# Patient Record
Sex: Female | Born: 1980 | Hispanic: Yes | Marital: Married | Smoking: Never smoker
Health system: Southern US, Community
[De-identification: ages and names within clinical notes are randomized; demographics above are authoritative.]

## PROBLEM LIST (undated history)

## (undated) ENCOUNTER — Inpatient Hospital Stay (HOSPITAL_COMMUNITY): Payer: Self-pay

## (undated) DIAGNOSIS — D696 Thrombocytopenia, unspecified: Secondary | ICD-10-CM

## (undated) HISTORY — PX: NO PAST SURGERIES: SHX2092

---

## 2014-08-19 NOTE — L&D Delivery Note (Cosign Needed)
Delivery Note At 5:36 AM a viable female was delivered via Vaginal, Spontaneous Delivery (Presentation: OA ) by Dr Freida Busman.  APGAR: 7, 9; weight 4 lb 15.5 oz (2255 g).  Infant dried and placed on pt's abd.  Cord clamped and cut by FOB. Hospital cord blood sample collected.  Placenta status: Intact, Spontaneous.  Cord: 3 vessels   Anesthesia: None  Episiotomy: None Lacerations: None Est. Blood Loss (mL):  100  Pt given  of cytotec PO as a preventative measure. FF w/ nl lochia.  Mom to postpartum.  Baby to Couplet care / Skin to Skin.   Cam Hai CNM 03/30/2015, 5:58 AM

## 2014-10-18 ENCOUNTER — Inpatient Hospital Stay (HOSPITAL_COMMUNITY)
Admission: AD | Admit: 2014-10-18 | Discharge: 2014-10-18 | Disposition: A | Discharge status: Home / Self Care | Payer: Medicaid Other | Source: Ambulatory Visit | Attending: Family Medicine | Admitting: Family Medicine

## 2014-10-18 ENCOUNTER — Inpatient Hospital Stay (HOSPITAL_COMMUNITY): Payer: Medicaid Other

## 2014-10-18 ENCOUNTER — Encounter (HOSPITAL_COMMUNITY): Payer: Self-pay

## 2014-10-18 DIAGNOSIS — Z3A15 15 weeks gestation of pregnancy: Secondary | ICD-10-CM

## 2014-10-18 DIAGNOSIS — O209 Hemorrhage in early pregnancy, unspecified: Secondary | ICD-10-CM | POA: Insufficient documentation

## 2014-10-18 DIAGNOSIS — R109 Unspecified abdominal pain: Secondary | ICD-10-CM

## 2014-10-18 DIAGNOSIS — Z3A16 16 weeks gestation of pregnancy: Secondary | ICD-10-CM | POA: Insufficient documentation

## 2014-10-18 DIAGNOSIS — O9989 Other specified diseases and conditions complicating pregnancy, childbirth and the puerperium: Secondary | ICD-10-CM

## 2014-10-18 DIAGNOSIS — O26899 Other specified pregnancy related conditions, unspecified trimester: Secondary | ICD-10-CM | POA: Insufficient documentation

## 2014-10-18 HISTORY — DX: Thrombocytopenia, unspecified: D69.6

## 2014-10-18 LAB — CBC WITH DIFFERENTIAL/PLATELET
Basophils Absolute: 0 10*3/uL (ref 0.0–0.1)
Basophils Relative: 0 % (ref 0–1)
EOS PCT: 1 % (ref 0–5)
Eosinophils Absolute: 0.1 10*3/uL (ref 0.0–0.7)
HCT: 35.6 % — ABNORMAL LOW (ref 36.0–46.0)
HEMOGLOBIN: 12.1 g/dL (ref 12.0–15.0)
LYMPHS ABS: 1.8 10*3/uL (ref 0.7–4.0)
LYMPHS PCT: 20 % (ref 12–46)
MCH: 27.7 pg (ref 26.0–34.0)
MCHC: 34 g/dL (ref 30.0–36.0)
MCV: 81.5 fL (ref 78.0–100.0)
MONO ABS: 0.5 10*3/uL (ref 0.1–1.0)
Monocytes Relative: 6 % (ref 3–12)
Neutro Abs: 6.4 10*3/uL (ref 1.7–7.7)
Neutrophils Relative %: 73 % (ref 43–77)
Platelets: 253 10*3/uL (ref 150–400)
RBC: 4.37 MIL/uL (ref 3.87–5.11)
RDW: 13.7 % (ref 11.5–15.5)
WBC: 8.8 10*3/uL (ref 4.0–10.5)

## 2014-10-18 LAB — OB RESULTS CONSOLE GC/CHLAMYDIA: GC PROBE AMP, GENITAL: NEGATIVE

## 2014-10-18 LAB — COMPREHENSIVE METABOLIC PANEL
ALBUMIN: 3.7 g/dL (ref 3.5–5.2)
ALT: 19 U/L (ref 0–35)
ANION GAP: 9 (ref 5–15)
AST: 22 U/L (ref 0–37)
Alkaline Phosphatase: 48 U/L (ref 39–117)
BUN: 7 mg/dL (ref 6–23)
CALCIUM: 8.9 mg/dL (ref 8.4–10.5)
CO2: 25 mmol/L (ref 19–32)
CREATININE: 0.35 mg/dL — AB (ref 0.50–1.10)
Chloride: 104 mmol/L (ref 96–112)
GFR calc Af Amer: >90 mL/min (ref >90)
GFR calc non Af Amer: >90 mL/min (ref >90)
Glucose, Bld: 90 mg/dL (ref 70–99)
Potassium: 3.8 mmol/L (ref 3.5–5.1)
Sodium: 138 mmol/L (ref 135–145)
TOTAL PROTEIN: 7.3 g/dL (ref 6.0–8.3)
Total Bilirubin: 0.2 mg/dL — ABNORMAL LOW (ref 0.3–1.2)

## 2014-10-18 LAB — URINALYSIS, ROUTINE W REFLEX MICROSCOPIC
BILIRUBIN URINE: NEGATIVE
GLUCOSE, UA: NEGATIVE mg/dL
Ketones, ur: 15 mg/dL — AB
Leukocytes, UA: NEGATIVE
Nitrite: NEGATIVE
PROTEIN: NEGATIVE mg/dL
Specific Gravity, Urine: 1.025 (ref 1.005–1.030)
UROBILINOGEN UA: 0.2 mg/dL (ref 0.0–1.0)
pH: 5.5 (ref 5.0–8.0)

## 2014-10-18 LAB — URINE MICROSCOPIC-ADD ON

## 2014-10-18 LAB — WET PREP, GENITAL
Clue Cells Wet Prep HPF POC: NONE SEEN
Trich, Wet Prep: NONE SEEN
Yeast Wet Prep HPF POC: NONE SEEN

## 2014-10-18 NOTE — MAU Note (Signed)
LMP 06/29/14; has not been seen anywhere during this pregnancy. Vaginal bleeding x 1 month, 2-3 times per week; sometimes has to wear a pad, no clots.Passed out today d/t abdominal pain and dizziness.

## 2014-10-18 NOTE — MAU Provider Note (Signed)
History     CSN: [id number]  Arrival date and time: 10/18/14 4098   First Provider Initiated Contact with Patient 10/18/14 2042      Chief Complaint  Patient presents with  . Abdominal Pain  . Vaginal Bleeding   HPI  Ms. Dana Gibson is a 34 y.o. G2P0010 at [redacted]w[redacted]d who presents to MAU today with complaint of vaginal bleeding. The patient has not been seen anywhere yet for this pregnancy. She states that she has noted bleeding ~ 3 days/week x 4-6 weeks. She has some bleeding earlier today, but denies bleeding upon arrival in MAU. She states mild cramping earlier today as well that has resolved. She has also had morning sickness. She denies nausea now. She denies vaginal discharge or complications with her previous pregnancy. She does have a history of thrombocytopenia.   OB History    Gravida Para Term Preterm AB TAB SAB Ectopic Multiple Living   Past Medical History  Diagnosis Date  . Thrombocytopenia     History reviewed. No pertinent past surgical history.  History reviewed. No pertinent family history.  History  Substance Use Topics  . Smoking status: Never Smoker   . Smokeless tobacco: Not on file  . Alcohol Use: No    Allergies:  Allergies  Allergen Reactions  . Asa [Aspirin] Other (See Comments)    PT HAS THROMBOCYTOPENIA-   ASA   MAKES  HER BLEED MORE    No prescriptions prior to admission    Review of Systems  Constitutional: Negative for fever.  Gastrointestinal: Positive for nausea and vomiting. Negative for abdominal pain, diarrhea and constipation.  Genitourinary: Negative for dysuria, urgency and frequency.       + vaginal bleeding   Physical Exam   Blood pressure 122/81, pulse 90, temperature 98.6 F (37 C), temperature source Oral, resp. rate 16, height  (1.575 m), weight 130 lb (58.968 kg), last menstrual period 06/29/2014, SpO2 100 %.  Physical Exam  Constitutional: She is oriented to person, place, and time.  She appears well-developed and well-nourished.  HENT:  Head: Normocephalic.  Cardiovascular: Normal rate.   Respiratory: Effort normal.  GI: Soft. She exhibits no distension and no mass. There is no tenderness. There is no rebound and no guarding.  Genitourinary: Uterus is enlarged (appropriate for GA). Uterus is not tender. Cervix exhibits friability. Cervix exhibits no motion tenderness and no discharge. No bleeding in the vagina. Vaginal discharge (small amount of clear, mucus discharge noted) found.  Cervix: closed, thick  Neurological: She is alert and oriented to person, place, and time.  Skin: Skin is warm and dry. No erythema.  Psychiatric: She has a normal mood and affect.   Results for orders placed or performed during the hospital encounter of 10/18/14 (from the past 24 hour(s))  Urinalysis, Routine w reflex microscopic     Status: Abnormal   Collection Time: 10/18/14  7:49 PM  Result Value Ref Range   Color, Urine YELLOW YELLOW   APPearance CLEAR CLEAR   Specific Gravity, Urine 1.025 1.005 - 1.030   pH 5.5 5.0 - 8.0   Glucose, UA NEGATIVE NEGATIVE mg/dL   Hgb urine dipstick SMALL (A) NEGATIVE   Bilirubin Urine NEGATIVE NEGATIVE   Ketones, ur 15 (A) NEGATIVE mg/dL   Protein, ur NEGATIVE NEGATIVE mg/dL   Urobilinogen, UA 0.2 0.0 - 1.0 mg/dL   Nitrite NEGATIVE NEGATIVE   Leukocytes,  UA NEGATIVE NEGATIVE  Urine microscopic-add on     Status: None   Collection Time: 10/18/14  7:49 PM  Result Value Ref Range   Squamous Epithelial / LPF RARE RARE   WBC, UA 0-2 <3 WBC/hpf   RBC / HPF 0-2 <3 RBC/hpf   Bacteria, UA RARE RARE  CBC with Differential/Platelet     Status: Abnormal   Collection Time: 10/18/14  8:51 PM  Result Value Ref Range   WBC 8.8 4.0 - 10.5 K/uL   RBC 4.37 3.87 - 5.11 MIL/uL   Hemoglobin 12.1 12.0 - 15.0 g/dL   HCT 16.135.6 (L) 09.636.0 - 04.546.0 %   MCV 81.5 78.0 - 100.0 fL   MCH 27.7 26.0 - 34.0 pg   MCHC 34.0 30.0 - 36.0 g/dL   RDW 40.913.7 81.111.5 - 91.415.5 %    Platelets 253 150 - 400 K/uL   Neutrophils Relative % 73 43 - 77 %   Neutro Abs 6.4 1.7 - 7.7 K/uL   Lymphocytes Relative 20 12 - 46 %   Lymphs Abs 1.8 0.7 - 4.0 K/uL   Monocytes Relative 6 3 - 12 %   Monocytes Absolute 0.5 0.1 - 1.0 K/uL   Eosinophils Relative 1 0 - 5 %   Eosinophils Absolute 0.1 0.0 - 0.7 K/uL   Basophils Relative 0 0 - 1 %   Basophils Absolute 0.0 0.0 - 0.1 K/uL  Comprehensive metabolic panel     Status: Abnormal   Collection Time: 10/18/14  8:51 PM  Result Value Ref Range   Sodium 138 135 - 145 mmol/L   Potassium 3.8 3.5 - 5.1 mmol/L   Chloride 104 96 - 112 mmol/L   CO2 25 19 - 32 mmol/L   Glucose, Bld 90 70 - 99 mg/dL   BUN 7 6 - 23 mg/dL   Creatinine, Ser 7.820.35 (L) 0.50 - 1.10 mg/dL   Calcium 8.9 8.4 - 95.610.5 mg/dL   Total Protein 7.3 6.0 - 8.3 g/dL   Albumin 3.7 3.5 - 5.2 g/dL   AST 22 0 - 37 U/L   ALT 19 0 - 35 U/L   Alkaline Phosphatase 48 39 - 117 U/L   Total Bilirubin 0.2 (L) 0.3 - 1.2 mg/dL   GFR calc non Af Amer >90 >90 mL/min   GFR calc Af Amer >90 >90 mL/min   Anion gap 9 5 - 15  Wet prep, genital     Status: Abnormal   Collection Time: 10/18/14 10:38 PM  Result Value Ref Range   Yeast Wet Prep HPF POC NONE SEEN NONE SEEN   Trich, Wet Prep NONE SEEN NONE SEEN   Clue Cells Wet Prep HPF POC NONE SEEN NONE SEEN   WBC, Wet Prep HPF POC FEW (A) NONE SEEN     MAU Course  Procedures None  MDM FHR - 153 bpm with doppler UA, wet prep, GC/Chlamydia, CBC and US today Preliminary US report is normal, no evidence of previa or abruption, normal cervical length 3.9 cm.  Assessment and Plan  A: SIUP at 15w Vaginal bleeding in pregnancy prior to [redacted] weeks gestation  P: Discharge home Bleeding precautions discussed [facility] per her request Patient may return to MAU as needed or if her condition were to change or worsen   Marny LowensteinJulie N Kortland Nichols, PA-C  10/19/2014, 12:12 AM

## 2014-10-18 NOTE — Progress Notes (Signed)
I assisted Programmer, multimediaDebra RN with some questions and Raynelle FanningJulie as well, by Orlan LeavensViria Alvarez Spanish Interpreter.

## 2014-10-18 NOTE — MAU Note (Signed)
PT SAYS  WITH INTERPRETER- VIRIA -        1 MTH AGO- SHE WOULD  BLEED 2-3X  / WEEK.   THEN  ON Sunday  - SHE WAS DIZZY  AND FELL X2 ON FLOOR   - SHE WAS    BLEEDING -   SHE VOIDED- A LOT OF BLOOD   COMING OUT OF VAG-   TOOK SHOWER-  THEN SAW BLOOD ON UNDERWEAR AND LEGS.        ON  Monday-  SPOTTING ON PAD.    THEN TONIGHT    FEELS TIRED AND DIZZY- AND PASSED OUT AT WORK AT  1PM-  FELL ON FLOOR  AND  HIT HEAD - RIGHT SIDE.        NOW IN MAU-  NO BLEEDING-   NO DIZZINESS

## 2014-10-18 NOTE — Discharge Instructions (Signed)
Vaginal Bleeding During Pregnancy, Second Trimester ° A small amount of bleeding (spotting) from the vagina is common in pregnancy. Sometimes the bleeding is normal and is not a problem, and sometimes it is a sign of something serious. Be sure to tell your doctor about any bleeding from your vagina right away. °HOME CARE °· Watch your condition for any changes. °· Follow your doctor's instructions about how active you can be. °· If you are on bed rest: °¨ You may need to stay in bed and only get up to use the bathroom. °¨ You may be allowed to do some activities. °¨ If you need help, make plans for someone to help you. °· Write down: °¨ The number of pads you use each day. °¨ How often you change pads. °¨ How soaked (saturated) your pads are. °· Do not use tampons. °· Do not douche. °· Do not have sex or orgasms until your doctor says it is okay. °· If you pass any tissue from your vagina, save the tissue so you can show it to your doctor. °· Only take medicines as told by your doctor. °· Do not take aspirin because it can make you bleed. °· Do not exercise, lift heavy weights, or do any activities that take a lot of energy and effort unless your doctor says it is okay. °· Keep all follow-up visits as told by your doctor. °GET HELP IF:  °· You bleed from your vagina. °· You have cramps. °· You have labor pains. °· You have a fever that does not go away after you take medicine. °GET HELP RIGHT AWAY IF: °· You have very bad cramps in your back or belly (abdomen). °· You have contractions. °· You have chills. °· You pass large clots or tissue from your vagina. °· You bleed more. °· You feel light-headed or weak. °· You pass out (faint). °· You are leaking fluid or have a gush of fluid from your vagina. °MAKE SURE YOU: °· Understand these instructions. °· Will watch your condition. °· Will get help right away if you are not doing well or get worse. °Document Released: 12/20/2013 Document Reviewed: 04/12/2013 °[employer]®  Patient Information ©2015 [employer]. This information is not intended to replace advice given to you by your health care provider. Make sure you discuss any questions you have with your health care provider. ° °

## 2014-10-19 ENCOUNTER — Encounter: Payer: Self-pay | Admitting: Obstetrics and Gynecology

## 2014-10-19 DIAGNOSIS — O209 Hemorrhage in early pregnancy, unspecified: Secondary | ICD-10-CM | POA: Insufficient documentation

## 2014-10-19 DIAGNOSIS — R109 Unspecified abdominal pain: Secondary | ICD-10-CM

## 2014-10-19 DIAGNOSIS — Z3A16 16 weeks gestation of pregnancy: Secondary | ICD-10-CM | POA: Insufficient documentation

## 2014-10-19 DIAGNOSIS — O26899 Other specified pregnancy related conditions, unspecified trimester: Secondary | ICD-10-CM | POA: Insufficient documentation

## 2014-10-19 LAB — HIV ANTIBODY (ROUTINE TESTING W REFLEX): HIV Screen 4th Generation wRfx: NONREACTIVE

## 2014-10-19 LAB — GC/CHLAMYDIA PROBE AMP (~~LOC~~) NOT AT ARMC
Chlamydia: NEGATIVE
Neisseria Gonorrhea: NEGATIVE

## 2014-11-10 ENCOUNTER — Encounter: Payer: Self-pay | Admitting: Obstetrics and Gynecology

## 2014-11-10 ENCOUNTER — Other Ambulatory Visit (HOSPITAL_COMMUNITY)
Admission: RE | Admit: 2014-11-10 | Discharge: 2014-11-10 | Disposition: A | Discharge status: Home / Self Care | Payer: Medicaid Other | Source: Ambulatory Visit | Attending: Obstetrics and Gynecology | Admitting: Obstetrics and Gynecology

## 2014-11-10 ENCOUNTER — Ambulatory Visit (INDEPENDENT_AMBULATORY_CARE_PROVIDER_SITE_OTHER): Payer: Medicaid Other | Admitting: Obstetrics and Gynecology

## 2014-11-10 ENCOUNTER — Telehealth: Payer: Self-pay | Admitting: Oncology

## 2014-11-10 VITALS — BP 118/68 | HR 105 | Temp 98.6°F | Wt 131.9 lb

## 2014-11-10 DIAGNOSIS — Z124 Encounter for screening for malignant neoplasm of cervix: Secondary | ICD-10-CM

## 2014-11-10 DIAGNOSIS — Z1151 Encounter for screening for human papillomavirus (HPV): Secondary | ICD-10-CM | POA: Diagnosis present

## 2014-11-10 DIAGNOSIS — Z23 Encounter for immunization: Secondary | ICD-10-CM

## 2014-11-10 DIAGNOSIS — Z01419 Encounter for gynecological examination (general) (routine) without abnormal findings: Secondary | ICD-10-CM | POA: Insufficient documentation

## 2014-11-10 DIAGNOSIS — R87810 Cervical high risk human papillomavirus (HPV) DNA test positive: Secondary | ICD-10-CM | POA: Diagnosis present

## 2014-11-10 DIAGNOSIS — O0990 Supervision of high risk pregnancy, unspecified, unspecified trimester: Secondary | ICD-10-CM | POA: Insufficient documentation

## 2014-11-10 DIAGNOSIS — Z3482 Encounter for supervision of other normal pregnancy, second trimester: Secondary | ICD-10-CM

## 2014-11-10 DIAGNOSIS — Z862 Personal history of diseases of the blood and blood-forming organs and certain disorders involving the immune mechanism: Secondary | ICD-10-CM | POA: Diagnosis not present

## 2014-11-10 LAB — POCT URINALYSIS DIP (DEVICE)
Bilirubin Urine: NEGATIVE
Glucose, UA: NEGATIVE mg/dL
Nitrite: NEGATIVE
Protein, ur: NEGATIVE mg/dL
Urobilinogen, UA: 0.2 mg/dL (ref 0.0–1.0)
pH: 5.5 (ref 5.0–8.0)

## 2014-11-10 NOTE — Progress Notes (Signed)
U/S 11/16/14 @ 3p with Radiology.  Contacted [facility] [phone]) for hematology appt.  Per Laurel DimmerKim H a doctor will review patient record and pt will be contacted either today or tomorrow regarding hematology appt.  Pt aware of plan.  Spanish interpreter Bettye Boeckori Arias present.  ROI obtained, [facility] [place].

## 2014-11-10 NOTE — Patient Instructions (Signed)
Segundo trimestre de Psychiatristembarazo (Second Trimester of Pregnancy) El segundo trimestre va desde la semana13 hasta la 28, desde el cuarto hasta el sexto mes, y suele ser el momento en el que mejor se siente. Su organismo se ha adaptado a Charity fundraiserestar embarazada y comienza a Diplomatic Services operational officersentirse fsicamente mejor. En general, las nuseas matutinas han disminuido o han desaparecido completamente, p El segundo trimestre es tambin la poca en la que el feto se desarrolla rpidamente. Hacia el final del sexto mes, el feto mide aproximadamente 9pulgadas (23cm) y pesa alrededor de 1 libras (700g). Es probable que sienta que el beb se Teacher, English as a foreign languagemueve (da pataditas) entre las 18 y 20semanas del Psychiatristembarazo. CAMBIOS EN EL ORGANISMO Su organismo atraviesa por muchos cambios durante el [place]embarazo, y estos varan de Neomia Dearuna mujer a Educational psychologistotra.   Seguir American Standard Companiesaumentando de peso. Notar que la parte baja del abdomen sobresale.  Podrn aparecer las primeras Albertson'sestras en las caderas, el abdomen y las [place] Shar[place]burgmamas.  Es posible que tenga dolores de cabeza que pueden aliviarse con los medicamentos que su mdico autorice.  Tal vez tenga necesidad de orinar con ms frecuencia porque el feto est ejerciendo presin Ambulance personsobre la vejiga.  Debido al Vanetta Muldersembarazo podr sentir Anthoney Haradaacidez estomacal con frecuencia.  Puede estar estreida, ya que ciertas hormonas enlentecen los movimientos de los msculos que New York Life Insuranceempujan los desechos a travs de los intestinos.  Pueden aparecer hemorroides o abultarse e hincharse las venas (venas varicosas).  Puede tener dolor de espalda que se debe al Citigroupaumento de peso y a que las hormonas del Management consultantembarazo relajan las articulaciones entre los huesos de la pelvis, y Public librariancomo consecuencia de la modificacin del peso y los msculos que mantienen el equilibrio.  Las ConAgra Foodsmamas seguirn creciendo y Development worker, communityle dolern.  Las Veterinary surgeonencas pueden sangrar y estar sensibles al cepillado y al hilo dental.  Pueden aparecer zonas oscuras o manchas (cloasma, mscara del Psychiatristembarazo) en el rostro que  probablemente se atenuarn despus del nacimiento del beb.  Es posible que se forme una lnea oscura desde el ombligo hasta la zona del pubis (linea nigra) que probablemente se atenuarn despus del nacimiento del beb.  Tal vez haya cambios en el cabello que pueden incluir su engrosamiento, crecimiento rpido y cambios en la textura. Adems, a algunas mujeres se les cae el cabello durante o despus del embarazo, o tienen el cabello seco o fino. Lo ms probable es que el cabello se le normalice despus del nacimiento del beb. QU DEBE ESPERAR EN LAS CONSULTAS PRENATALES Durante una visita prenatal de rutina:  La pesarn para asegurarse de que usted y el feto estn creciendo normalmente.  Le tomarn la presin arterial.  Le medirn el abdomen para controlar el desarrollo del beb.  Se escucharn los latidos cardacos fetales.  Se evaluarn los resultados de los estudios solicitados en visitas anteriores. El mdico puede preguntarle lo siguiente:  Cmo se siente.  Si siente los movimientos del beb.  Si ha tenido sntomas anormales, como prdida de lquido, Winterssangrado, dolores de cabeza intensos o clicos abdominales.  Si tiene Colgate-Palmolivealguna pregunta. Otros estudios que podrn realizarse durante el segundo trimestre incluyen lo siguiente:  Anlisis de sangre para detectar:  Concentraciones de hierro bajas (anemia).  Diabetes gestacional (entre la semana 24 y la 28).  Anticuerpos Rh.  Anlisis de orina para detectar infecciones, diabetes o protenas en la orina.  Una ecografa para confirmar que el beb crece y se desarrolla correctamente.  Una amniocentesis para diagnosticar posibles problemas genticos.  Estudios del feto para descartar espina  bfida y sndrome de Down. INSTRUCCIONES PARA EL CUIDADO EN EL HOGAR   Evite fumar, consumir hierbas, beber alcohol y tomar frmacos que no le hayan recetado. Estas sustancias qumicas afectan la formacin y el desarrollo del beb.  Siga  las indicaciones del mdico en relacin con el uso de medicamentos. Durante el embarazo, hay medicamentos que son seguros de tomar y otros que no.  Haga actividad fsica solo en la forma indicada por el mdico. Sentir clicos uterinos es un buen signo para Restaurant manager, fast fooddetener la actividad fsica.  Contine comiendo alimentos que sanos con regularidad.  Use un sostn que le brinde buen soporte si le Altria Groupduelen las mamas.  No se d baos de inmersin en agua caliente, baos turcos ni saunas.  Colquese el cinturn de seguridad cuando conduzca.  No coma carne cruda ni queso sin cocinar; evite el contacto con las bandejas sanitarias de los gatos y la tierra que estos animales usan. Estos elementos contienen grmenes que pueden causar defectos congnitos en el beb.  Tome las vitaminas prenatales.  Si est estreida, pruebe un laxante suave (si el mdico lo autoriza). Consuma ms alimentos ricos en fibra, como vegetales y frutas frescos y Radiation protection practitionercereales integrales. Beba gran cantidad de lquido para mantener la orina de tono claro o color amarillo plido.  Dese baos de asiento con agua tibia para Engineer, materialsaliviar el dolor o las molestias causadas por las hemorroides. Use una crema para las hemorroides si el mdico la autoriza.  Si tiene venas varicosas, use medias de descanso. Eleve los pies durante 15minutos, 3 o 4veces por da. Limite la cantidad de sal en su dieta.  No levante objetos pesados, use zapatos de tacones bajos y [address] Boulevardmantenga una buena postura.  Descanse con las piernas elevadas si tiene calambres o dolor de cintura.  Visite a su dentista si an no lo ha Occupational hygienisthecho durante el embarazo. Use un cepillo de dientes blando para higienizarse los dientes y psese el hilo dental con suavidad.  Puede seguir Calpine Corporationmanteniendo relaciones sexuales, a menos que el mdico le indique lo contrario.  Concurra a todas las visitas prenatales segn las indicaciones de su mdico. SOLICITE ATENCIN MDICA SI:   Santa Generaiene mareos.  Siente  clicos leves, presin en la pelvis o dolor persistente en el abdomen.  Tiene nuseas, vmitos o diarrea persistentes.  Tiene secrecin vaginal con mal olor.  Siente dolor al ConocoPhillipsorinar. SOLICITE ATENCIN MDICA DE INMEDIATO SI:   Tiene fiebre.  Tiene una prdida de lquido por la vagina.  Tiene sangrado o pequeas prdidas vaginales.  Siente dolor intenso o clicos en el abdomen.  Sube o baja de peso rpidamente.  Tiene dificultad para respirar y siente dolor de pecho.  Sbitamente se le hinchan mucho el rostro, las South Wilmingtonmanos, los tobillos, los pies o las piernas.  No ha sentido los movimientos del beb durante Georgianne Fickuna hora.  Siente un dolor de cabeza intenso que no se alivia con medicamentos.  Hay cambios en la visin. Document Released: 05/15/2005 Document Revised: 08/10/2013 [facility] LLCExitCare Patient Information 2015 [place]apanExitCare, MarylandLLC. This information is not intended to replace advice given to you by your health care provider. Make sure you discuss any questions you have with your health care provider.  Eleccin del mtodo anticonceptivo (Contraception Choices) La anticoncepcin (control de la natalidad) es el uso de cualquier mtodo o dispositivo para Location managerevitar el embarazo. A continuacin se indican algunos de esos mtodos. MTODOS HORMONALES   El Implante contraconceptivo consiste en un tubo plstico delgado que contiene la hormona progesterona. No contiene  estrgenos. El mdico inserta el tubo en la parte interna del brazo. El tubo puede Geneticist, molecularpermanecer en el lugar durante 3 aos. Despus de los 3 aos debe retirarse. El implante impide que los ovarios liberen vulos (ovulacin), espesa el moco cervical, lo que evita que los espermatozoides ingresen al tero y hace ms delgada la membrana que cubre el interior del tero.  Inyecciones de progesterona sola: las Insurance underwriteradministra el mdico cada 3 meses para Location managerevitar el embarazo. La progesterona sinttica impide que los ovarios liberen vulos. Tambin hacen que el  moco cervical se espese y modifique el tejido de recubrimiento interno del tero. Esto hace ms difcil que los espermatozoides sobrevivan en el tero.  Las pldoras anticonceptivas contienen estrgenos y Education officer, museumprogesterona. Su funcin es ALLTEL Corporationevitar que los ovarios liberen vulos (ovulacin). Las hormonas de los anticonceptivos orales hacen que el moco cervical se haga ms espeso, lo que evita que el esperma ingrese al tero. Las pldoras anticonceptivas son recetadas por el mdico.Tambin se utilizan para tratar los perodos menstruales abundantes.  Minipldora: este tipo de pldora anticonceptiva contiene slo hormona progesterona. Deben tomarse todos los [address]  [address] del mes y debe recetarlas el mdico.  El parche de control de natalidad: contiene hormonas similares a las que contienen las pldoras anticonceptivas. Deben cambiarse una vez por semana y se utilizan bajo prescripcin mdica.  Anillo vaginal: contiene hormonas similares a las que contienen las pldoras anticonceptivas. Se deja colocado durante tres semanas, se lo retira durante 1 semana y luego se coloca uno nuevo. La paciente debe sentirse cmoda al insertar y retirar el anillo de la vagina.Es necesaria la prescripcin mdica.  Anticonceptivos de emergencia: son mtodos para evitar un embarazo despus de Neomia Dearuna relacin sexual sin proteccin. Esta pldora puede tomarse inmediatamente despus de Child psychotherapisttener relaciones sexuales o hasta 5 Urbanadas de haber tenido sexo sin proteccin. Es ms efectiva si se toma poco tiempo despus de la relacin sexual. Los anticonceptivos de emergencia estn disponibles sin prescripcin mdica. Consltelo con su farmacutico. No use los anticonceptivos de emergencia como nico mtodo anticonceptivo. MTODOS DE Lenis NoonBARRERA   Condn masculino: es una vaina delgada (ltex o goma) que se coloca cubriendo al pene durante el acto sexual. Deri Fuellinguede usarse con espermicida para aumentar la efectividad.  Condn femenino. Es una funda delicada y  blanda que se adapta holgadamente a la vagina antes de las Clinical research associaterelaciones sexuales.  Diafragma: es una barrera de ltex redonda y suave que debe ser recomendado por un profesional. Se inserta en la vagina, junto con un gel espermicida. Debe insertarse antes de Management consultanttener relaciones sexuales. Debe dejar el diafragma colocado en la vagina durante 6 a 8 horas despus de la relacin sexual.  Capuchn cervical: es una barrera de ltex o taza plstica redonda y Bahamassuave que cubre el cuello del tero y debe ser colocada por un mdico. Puede dejarlo colocado en la vagina hasta 48 horas despus de las Clinical research associaterelaciones sexuales.  Esponja: es una pieza blanda y circular de espuma de poliuretano. Contiene un espermicida. Se inserta en la vagina despus de mojarla y antes de las The St. Paul Travelersrelaciones sexuales.  Espermicidas: son sustancias qumicas que matan o bloquean al esperma y no lo dejan ingresar al cuello del tero y al tero. Vienen en forma de cremas, geles, supositorios, espuma o comprimidos. No es necesario tener Emergency planning/management officerreceta mdica. Se insertan en la vagina con un aplicador antes de Management consultanttener relaciones sexuales. El proceso debe repetirse cada vez que tiene relaciones sexuales. ANTICONCEPTIVOS INTRAUTERINOS  Dispositivo intrauterino (DIU) es un dispositivo en forma de T  que se coloca en el tero durante el perodo menstrual, para Location managerevitar el embarazo. Hay dos tipos:  DIU de cobre: este tipo de DIU est recubierto con un alambre de cobre y se inserta dentro del tero. El cobre hace que el tero y las trompas de Falopio produzcan un liquido que Federated Department Storesdestruye los espermatozoides. Puede permanecer colocado durante 10 aos.  DIU con hormona: este tipo de DIU contiene la hormona progestina (progesterona sinttica). La hormona espesa el moco cervical y evita que los espermatozoides ingresen al tero y tambin afina la membrana que cubre el tero para evitar la implantacin del vulo fertilizado. La hormona debilita o destruye los espermatozoides que  ingresan al tero. Puede Geneticist, molecularpermanecer en el lugar durante 3-5 aos, segn el tipo de DIU que se McAlmontutilice. MTODOS ANTICONCEPTIVOS PERMANENTES  Ligadura de trompas en la mujer: se realiza sellando, atando u obstruyendo quirrgicamente las trompas de Falopio lo que impide que el vulo descienda hacia el tero.  Esterilizacin histeroscpica: Implica la colocacin de un pequeo espiral o la insercin en cada trompa de Falopio. El mdico utiliza una tcnica llamada histeroscopa para Primary school teacherrealizar este procedimiento. El dispositivo produce la formacin de tejido Designer, television/film setcicatrizal. Esto da como resultado una obstruccin permanente de las trompas de Falopio, de modo que la esperma no pueda fertilizar el vulo. Demora alrededor de 3 meses despus del procedimiento hasta que el conducto se obstruye. Tendr que usar otro mtodo anticonceptivo durante al menos 3 meses.  Esterilizacin masculina: se realiza ligando los conductos por los que pasan los espermatozoides (vasectoma).Esto impide que el esperma ingrese a la vagina durante el acto sexual. Luego del procedimiento, el hombre puede eyacular lquido (semen). MTODOS DE PLANIFICACIN NATURAL  Planificacin familiar natural: consiste en no Management consultanttener relaciones sexuales o usar un mtodo de barrera (condn, Bellflowerdiafragma, capuchn cervical) en los IKON Office Solutionsdas que la mujer podra quedar State Lineembarazada.  Mtodo de calendario: consiste en el seguimiento de la duracin de cada ciclo menstrual y la identificacin de los perodos frtiles.  Mtodo de ovulacin: Paramedicconsiste en evitar las relaciones sexuales durante la ovulacin.  Mtodo sintotrmico: Advertising copywriterconsiste en evitar las relaciones sexuales en la poca en la que se est ovulando, utilizando un termmetro y tendiendo en cuenta los sntomas de la ovulacin.  Mtodo postovulacin: Youth workerconsiste en planificar las relaciones sexuales para despus de haber ovulado. Independientemente del tipo o mtodo anticonceptivo que usted elija, es importante que use  condones para protegerse contra las infecciones de transmisin sexual (ETS). Hable con su mdico con respecto a qu mtodo anticonceptivo es el ms apropiado para usted. Document Released: 08/05/2005 Document Revised: 04/07/2013 [facility]Exit[facility] Patient Information 2015 [place]olkExitCare, MarylandLLC. This information is not intended to replace advice given to you by your health care provider. Make sure you discuss any questions you have with your health care provider.  Lactancia materna (Breastfeeding) Decidir Museum/gallery exhibitions officeramamantar es una de las mejores elecciones que puede hacer por usted y su beb. El cambio hormonal durante el Psychiatristembarazo produce el desarrollo del tejido mamario y Lesothoaumenta la cantidad y el tamao de los conductos galactforos. Estas hormonas tambin permiten que las protenas, los azcares y las grasas de la sangre produzcan la WPS Resourcesleche materna en las glndulas productoras de Columbia Fallsleche. Las hormonas impiden que la leche materna sea liberada antes del nacimiento del beb, adems de impulsar el flujo de leche luego del nacimiento. Una vez que ha comenzado a Museum/gallery exhibitions officeramamantar, Conservation officer, naturepensar en el beb, as Immunologistcomo la succin o Theatre managerel llanto, pueden estimular la liberacin de Actonleche de las glndulas productoras de Sardis Cityleche.  LOS BENEFICIOS DE AMAMANTAR Para el beb  La primera leche (calostro) ayuda a Careers information officermejorar el funcionamiento del sistema digestivo del beb.  La leche tiene anticuerpos que ayudan a Radio producerprevenir las infecciones en el beb.  El beb tiene una menor incidencia de asma, alergias y del sndrome de muerte sbita del lactante.  Los nutrientes en la W[place] Manor-Fisherleche materna son mejores para el beb que la Bruningleche maternizada y estn preparados exclusivamente para cubrir las necesidades del beb.  La leche materna mejora el desarrollo cerebral del beb.  Es menos probable que el beb desarrolle otras enfermedades, como obesidad infantil, asma o diabetes mellitus de tipo 2. Para usted   La lactancia materna favorece el desarrollo de un vnculo muy especial  entre la madre y el beb.  Es conveniente. La leche materna siempre est disponible a la Human resources officertemperatura correcta y es Urbancresteconmica.  La lactancia materna ayuda a quemar caloras y a perder el peso ganado durante el Sparrow Bushembarazo.  Favorece la contraccin del tero al tamao que tena antes del embarazo de manera ms rpida y disminuye el sangrado (loquios) despus del parto.  La lactancia materna contribuye a reducir Nurse, adultel riesgo de desarrollar diabetes mellitus de tipo 2, osteoporosis o cncer de mama o de ovario en el futuro. SIGNOS DE QUE EL BEB EST HAMBRIENTO Primeros signos de [address]re  Aumenta su estado de Lesothoalerta o actividad.  Se estira.  Mueve la cabeza de un lado a otro.  Mueve la cabeza y abre la boca cuando se le toca la mejilla o la comisura de la boca (reflejo de bsqueda).  Aumenta las vocalizaciones, tales como sonidos de succin, se relame los labios, emite arrullos, suspiros, o chirridos.  Mueve la Jones Apparel Groupmano hacia la boca.  Se chupa con ganas los dedos o las manos. Signos tardos de Fisher Scientifichambre  Est agitado.  Llora de manera intermitente. Signos de AES Corporationhambre extrema Los signos de hambre extrema requerirn que lo calme y lo consuele antes de que el beb pueda alimentarse adecuadamente. No espere a que se manifiesten los siguientes signos de hambre extrema para comenzar a Museum/gallery exhibitions officeramamantar:   Designer, jewelleryAgitacin.  Llanto intenso y fuerte.   Gritos. INFORMACIN BSICA SOBRE LA LACTANCIA MATERNA Iniciacin de la lactancia materna  Encuentre un lugar cmodo para sentarse o acostarse, con un buen respaldo para el cuello y la espalda.  Coloque una almohada o una manta enrollada debajo del beb para acomodarlo a la altura de la mama (si est sentada). Las almohadas para Museum/gallery exhibitions officeramamantar se han diseado especialmente a fin de servir de apoyo para los brazos y el beb Smithfield Foodsmientras amamanta.  Asegrese de que el abdomen del beb est frente al suyo.  Masajee suavemente la mama. Con las yemas de los dedos, masajee la  pared del pecho hacia el pezn en un movimiento circular. Esto estimula el flujo de Bayviewleche. Es posible que Engineer, manufacturing systemsdeba continuar este movimiento mientras amamanta si la leche fluye lentamente.  Sostenga la mama con el pulgar por arriba del pezn y los otros 4 dedos por debajo de la mama. Asegrese de que los dedos se encuentren lejos del pezn y de la boca del beb.  Empuje suavemente los labios del beb con el pezn o con el dedo.  Cuando la boca del beb se abra lo suficiente, acrquelo rpidamente a la mama e introduzca todo el pezn y la zona oscura que lo rodea (areola), tanto como sea posible, dentro de la boca del beb.  Debe haber ms areola visible por arriba del labio superior del  beb que por debajo del labio inferior.  La lengua del beb debe estar entre la enca inferior y la [place].  Asegrese de que la boca del beb est en la posicin correcta alrededor del pezn (prendida). Los labios del beb deben crear un sello sobre la mama y estar doblados hacia afuera (invertidos).  Es comn que el beb succione durante 2 a 3 minutos para que comience el flujo de [place]. Cmo debe prenderse Es muy importante que le ensee al beb cmo prenderse adecuadamente a la mama. Si el beb no se prende adecuadamente, puede causarle dolor en el pezn y reducir la produccin de [place], y hacer que el beb tenga un escaso aumento de [place]. Adems, si el beb no se prende adecuadamente al pezn, puede tragar aire durante la alimentacin. Esto puede causarle molestias al beb. Hacer eructar al beb al [place] de mama puede ayudarlo a liberar el aire. Sin embargo, ensearle al beb cmo prenderse a la mama adecuadamente es la mejor manera de evitar que se sienta molesto por tragar Oceanographer se alimenta. Signos de que el beb se ha prendido adecuadamente al pezn:   Payton Doughty o succiona de modo silencioso, sin causarle dolor.  Se escucha que traga cada 3 o 4 succiones.   Hay movimientos musculares  por arriba y por delante de sus odos al Printmaker. Signos de que el beb no se ha prendido Audiological scientist al pezn:   Hace ruidos de succin o de chasquido mientras se alimenta.  Siente dolor en el pezn. Si cree que el beb no se prendi correctamente, deslice el dedo en la comisura de la boca y Ameren Corporation las encas del beb para interrumpir la succin. Intente comenzar a amamantar nuevamente. Signos de Fish farm manager Signos del beb:   Disminuye gradualmente el nmero de succiones o cesa la succin por completo.  Se duerme.  Relaja el cuerpo.  Retiene una pequea cantidad de [facility] boca.  Se desprende solo del pecho. Signos que presenta usted:  Las mamas han aumentado la firmeza, el peso y el tamao 1 a 3 horas despus de Museum/gallery exhibitions officer.  Estn ms blandas inmediatamente despus de amamantar.  Un aumento del volumen de [place], y tambin un cambio en su consistencia y color se producen hacia el quinto da de Tour manager.  Los pezones no duelen, ni estn agrietados ni sangran. Signos de que su beb recibe la cantidad de leche suficiente  Moja al menos 3 paales en 24 horas. La orina debe ser clara y de color amarillo plido a los [address]  [address] de [place].  Defeca al menos 3 veces en 24 horas a los [address]  [address] de [address] La materia fecal debe ser blanda y [place].  Defeca al menos 3 veces en 24 horas a los [address] de [address] La materia fecal debe ser grumosa y [place].  No registra una prdida de peso mayor del 10% del peso al nacer durante los primeros 3 [address]  [address] de [place].  Aumenta de peso un promedio de 4 a 7onzas (113 a 198g) por semana despus de los [address] de vida.  Aumenta de [place], [place], de [place] uniforme a Glass blower/designer de los [address]  [address] de vida, sin Passenger transport manager prdida de peso despus de las 2semanas de vida. Despus de alimentarse, es posible que el beb regurgite una pequea cantidad. Esto es frecuente. FRECUENCIA Y DURACIN DE LA LACTANCIA MATERNA El  amamantamiento frecuente la ayudar a producir ms [place] y a Charity fundraiser en  los pezones e hinchazn en las mamas. Alimente al beb cuando muestre signos de hambre o si siente la necesidad de reducir la congestin de las [place]. Esto se denomina "lactancia a demanda". Evite el uso del chupete mientras trabaja para establecer la lactancia (las primeras 4 a 6 semanas despus del nacimiento del beb). Despus de este perodo, podr ofrecerle un chupete. Las investigaciones demostraron que el uso del chupete durante el primer ao de vida del beb disminuye el riesgo de desarrollar el sndrome de muerte sbita del lactante (SMSL). Permita que el nio se alimente en cada mama todo lo que desee. Contine amamantando al beb hasta que haya terminado de alimentarse. Cuando el beb se desprende o se queda dormido mientras se est alimentando de la primera mama, ofrzcale la segunda. Debido a que, con frecuencia, los recin [place] las primeras semanas de vida, es posible que deba despertar al beb para alimentarlo. Los horarios de Acupuncturist de un beb a otro. Sin embargo, las siguientes reglas pueden servir como gua para ayudarla a Lawyer que el beb se alimenta adecuadamente:  Se puede amamantar a los recin nacidos (bebs de 4 semanas o menos de vida) cada 1 a 3 horas.  No deben transcurrir ms de 3 horas durante el da o 5 horas durante la noche sin que se amamante a los recin nacidos.  Debe amamantar al beb 8 veces como mnimo en un perodo de 24 horas, hasta que comience a introducir slidos en su dieta, a los 6 meses de vida aproximadamente. EXTRACCIN DE [employer] MATERNA La extraccin y Contractor de la leche materna le permiten asegurarse de que el beb se alimente exclusivamente de [place], aun en momentos en los que no puede amamantar. Esto tiene especial importancia si debe regresar al Aleen Campi en el perodo en que an est amamantando o si no  puede estar presente en los momentos en que el beb debe alimentarse. Su asesor en lactancia puede orientarla sobre cunto tiempo es seguro almacenar [place].  El sacaleche es un aparato que le permite extraer leche de la mama a un recipiente estril. Luego, la leche materna extrada puede almacenarse en un refrigerador o Electrical engineer. Algunos sacaleches son Birdie Riddle, Delaney Meigs otros son elctricos. Consulte a su asesor en lactancia qu tipo ser ms conveniente para usted. Los sacaleches se pueden comprar; sin embargo, algunos hospitales y grupos de apoyo a la lactancia materna alquilan Sports coach. Un asesor en lactancia puede ensearle cmo extraer [employer], en caso de que prefiera no usar un sacaleche.  CMO CUIDAR LAS MAMAS DURANTE LA LACTANCIA MATERNA Los pezones se secan, agrietan y duelen durante la Tour manager. Las siguientes recomendaciones pueden ayudarla a Pharmacologist las [employer] y sanas:  Careers information officer usar jabn en los pezones.  Use un sostn de soporte. Aunque no son esenciales, las camisetas sin mangas o los sostenes especiales para Museum/gallery exhibitions officer estn diseados para acceder fcilmente a las mamas, para Museum/gallery exhibitions officer sin tener que quitarse todo el sostn o la camiseta. Evite usar sostenes con aro o sostenes muy ajustados.  Seque al aire sus pezones durante 3 a despus de amamantar al beb.  Utilice solo apsitos de Haematologist sostn para Environmental health practitioner las prdidas de [place]. La prdida de un poco de [employer] tomas es normal.  Utilice lanolina sobre los pezones luego de Museum/gallery exhibitions officer. La lanolina ayuda a mantener la humedad normal de la piel. Si [place] lanolina pura, no tiene que [employer]  antes de volver a alimentar al beb. La lanolina pura no es txica para el beb. Adems, puede extraer [employer] algunas gotas de [place] materna y Engineer, maintenance (IT) suavemente esa [employer], para que la [place] se seque al aire. Durante las  primeras semanas despus de dar a luz, algunas mujeres pueden experimentar hinchazn en las mamas (congestin [place]). La congestin puede hacer que sienta las mamas pesadas, calientes y sensibles al tacto. El pico de la congestin ocurre dentro de los 3 a 5 das despus del [place]. Las siguientes recomendaciones pueden ayudarla a Paramedic la congestin:  Vace por completo las mamas al [employer] o Environmental health practitioner. Puede aplicar calor hmedo en las mamas (en la ducha o con toallas hmedas para manos) antes de Museum/gallery exhibitions officer o extraer [employer]. Esto aumenta la circulacin y [place] a que la [place]. Si el beb no vaca por completo las [address] cuando lo [address], extraiga la [place] restante despus de que haya finalizado.  Use un sostn ajustado (para amamantar o comn) o una camiseta sin mangas durante 1 o 2 das para indicar al cuerpo que disminuya ligeramente la produccin de [place].  Aplique compresas de hielo [employer], a menos que le resulte demasiado incmodo.  Asegrese de que el beb est prendido y se encuentre en la posicin correcta mientras lo alimenta. Si la congestin persiste luego de 48 horas o despus de seguir estas recomendaciones, comunquese con su mdico o un Holiday representative. RECOMENDACIONES GENERALES PARA EL CUIDADO DE LA SALUD DURANTE LA LACTANCIA MATERNA  Consuma alimentos saludables. Alterne comidas y colaciones, y coma 3 de cada una por da. Dado que lo que come [employer], es posible que algunas comidas hagan que su beb se vuelva ms irritable de lo habitual. Evite comer este tipo de alimentos si percibe que afectan de manera negativa al beb.  Beba leche, jugos de fruta y agua para Patent examiner su sed (aproximadamente 10 vasos al Futures trader).  Descanse con frecuencia, reljese y tome sus vitaminas prenatales para evitar la fatiga, el estrs y la anemia.  Contine con los autocontroles de la mama.  Evite masticar y fumar tabaco.  Evite el consumo de alcohol y  drogas. Algunos medicamentos, que pueden ser perjudiciales para el beb, pueden pasar a travs de la [employer]. Es importante que consulte a su mdico antes de Medical sales representative, incluidos todos los medicamentos recetados y de [place], as como los suplementos vitamnicos y herbales. Puede quedar embarazada durante la lactancia. Si desea controlar la natalidad, consulte a su mdico cules son las opciones ms seguras para el beb. SOLICITE ATENCIN MDICA SI:   Usted siente que quiere dejar de Museum/gallery exhibitions officer o se siente frustrada con la lactancia.  Siente dolor en las mamas o en los pezones.  Sus pezones estn agrietados o Water quality scientist.  Sus pechos estn irritados, sensibles o calientes.  Tiene un rea hinchada en cualquiera de las mamas.  Siente escalofros o fiebre.  Tiene nuseas o vmitos.  Presenta una secrecin de otro lquido distinto de la leche materna de los pezones.  Sus mamas no se llenan antes de Museum/gallery exhibitions officer al beb para el quinto da despus del [place].  Se siente triste y deprimida.  El beb est demasiado somnoliento como para comer bien.  El beb tiene problemas para dormir.  Moja menos de 3 paales en 24 horas.  Defeca menos de 3 veces en 24 horas.  La piel del beb o la parte blanca de los ojos se vuelven  amarillentas.  El beb no ha aumentado de [place] a los [address] das de [place]. SOLICITE ATENCIN MDICA DE INMEDIATO SI:   El beb est muy cansado Retail buyer(letargo) y no se quiere despertar para comer.  Le sube la fiebre sin causa. Document Released: 08/05/2005 Document Revised: 08/10/2013 [facility] Patient Information 2015 [place]amisalExitCare, MarylandLLC. This information is not intended to replace advice given to you by your health care provider. Make sure you discuss any questions you have with your health care provider.

## 2014-11-10 NOTE — Progress Notes (Signed)
   Subjective:    Dana Gibson [name] is a [id number] 7186w1d being seen today for her first obstetrical visit.  Her obstetrical history is significant for a bleeding disorder. Patient is not clear of what her diagnosis is but decribes receiving a blood product following her vaginal delivery in order to facilitate coagulation. She required this product also to control her menorrhagia. She does not currently have a hematologist. [place] the [place] for the past year and a half and has not had any bleeding issues. She reports monthly cycles lasting 10 days. She does not take medication on a regular basis nor was she prescribed any medication. She states her bleeding disorder was diagnosed at the age of 89 in [place] following the development of spontaneous bruises in her lower extremities in the absence of trauma. Patient does intend to breast feed. Pregnancy history fully reviewed.  Patient reports no complaints.  Filed Vitals:   11/10/14 0821  BP: 118/68  Pulse: 105  Temp: 98.6 F (37 C)  Weight: 131 lb 14.4 oz (59.829 kg)    HISTORY: OB History  Gravida Para Term Preterm AB SAB TAB Ectopic Multiple Living  3 1 0  1 1    1     # Outcome Date GA Lbr Len/2nd Weight Sex Delivery Anes PTL Lv  3 Current           2 Para 01/25/04 4463w3d  7 lb 4 oz (3.289 kg) F Vag-Spont  N Y     Comments: no complications, born in [place]  1 SAB 07/1999 10130w0d            Past Medical History  Diagnosis Date  . Thrombocytopenia    History reviewed. No pertinent past surgical history. Family History  Problem Relation Age of Onset  . Hypertension Father      Exam    Uterus:     Pelvic Exam:    Perineum: Normal Perineum   Vulva: normal   Vagina:  normal mucosa, normal discharge   pH:    Cervix: closed and long   Adnexa: not evaluated   Bony Pelvis: gynecoid  System: Breast:  normal appearance, no masses or tenderness   Skin: normal coloration and turgor, no rashes    Neurologic: oriented, no focal  deficits   Extremities: normal strength, tone, and muscle mass   HEENT extra ocular movement intact   Mouth/Teeth mucous membranes moist, pharynx normal without lesions and dental hygiene good   Neck supple and no masses   Cardiovascular: regular rate and rhythm   Respiratory:  chest clear, no wheezing, crepitations, rhonchi, normal symmetric air entry   Abdomen: soft, gravid, NT   Urinary:       Assessment:    Pregnancy: [id number] Patient Active Problem List   Diagnosis Date Noted  . Supervision of other normal pregnancy, antepartum 11/10/2014    Priority: Medium  . History of bleeding disorder 11/10/2014    Priority: Medium        Plan:     Initial labs drawn. Prenatal vitamins. Problem list reviewed and updated. Genetic Screening discussed Quad Screen: requested.  Ultrasound discussed; fetal survey: ordered. Referral to hematology requested Will try to obtain delivery records from [place] which date back 10 years ago  Follow up in 4 weeks. 50% of 30 min visit spent on counseling and coordination of care.     Charmelle Soh 11/10/2014

## 2014-11-10 NOTE — Progress Notes (Signed)
Here for new ob visit. Used interpreter Darletta Mollorita Arias. Given new patient information in Spanish. States when in [place] was given capsules to help blood coagulate- took it when on period. States came to MAU 10/18/14 because having a lot of pain and spotting. Since then has still been having pain and spotting , but less. States spotting is very scant - about once or twice a week- and it is pinkish or sometimes brownish. Last pap 2 years ago.

## 2014-11-10 NOTE — Telephone Encounter (Signed)
CALLED, SPOKE WITH PATIENT'S MOTHER, SCHEDULED NEW PT APPT.  SHADAD 12/21/14 10:30AM  DX: HISTORY OF BLEEDING DISORDER [facility]

## 2014-11-10 NOTE — Progress Notes (Signed)
Nutrition note: 1st visit consult Pt has gained 6.9# @ 3412w1d, which is wnl. Pt reports eating 3 meals & 2 snacks/d. Pt is taking a PNV. Pt reports some nausea and heartburn. Pt received verbal & written education in Spanish via an interpreter about general nutrition during pregnancy. Discussed tips to decrease heartburn. Discussed wt gain goals of 25-35# or 1#/wk. Pt agrees to continue taking a PNV. Pt has WIC & plans to BF. F/u as needed Blondell RevealLaura Naliah Eddington, MS, RD, LDN, [facility] LLCBCLC

## 2014-11-11 LAB — PRENATAL PROFILE (SOLSTAS)
ANTIBODY SCREEN: NEGATIVE
Basophils Absolute: 0 10*3/uL (ref 0.0–0.1)
Basophils Relative: 0 % (ref 0–1)
Eosinophils Absolute: 0.1 10*3/uL (ref 0.0–0.7)
Eosinophils Relative: 2 % (ref 0–5)
HEMATOCRIT: 36 % (ref 36.0–46.0)
HEP B S AG: NEGATIVE
HIV 1&2 Ab, 4th Generation: NONREACTIVE
Hemoglobin: 12.4 g/dL (ref 12.0–15.0)
Lymphocytes Relative: 16 % (ref 12–46)
Lymphs Abs: 1.2 10*3/uL (ref 0.7–4.0)
MCH: 27.6 pg (ref 26.0–34.0)
MCHC: 34.4 g/dL (ref 30.0–36.0)
MCV: 80.2 fL (ref 78.0–100.0)
MONO ABS: 0.4 10*3/uL (ref 0.1–1.0)
MONOS PCT: 5 % (ref 3–12)
MPV: 9.3 fL (ref 8.6–12.4)
NEUTROS PCT: 77 % (ref 43–77)
Neutro Abs: 5.5 10*3/uL (ref 1.7–7.7)
Platelets: 270 10*3/uL (ref 150–400)
RBC: 4.49 MIL/uL (ref 3.87–5.11)
RDW: 15.4 % (ref 11.5–15.5)
RUBELLA: 10.3 {index} — AB (ref <0.90)
Rh Type: POSITIVE
WBC: 7.2 10*3/uL (ref 4.0–10.5)

## 2014-11-11 LAB — CULTURE, OB URINE
COLONY COUNT: NO GROWTH
Organism ID, Bacteria: NO GROWTH

## 2014-11-12 LAB — PRESCRIPTION MONITORING PROFILE (19 PANEL)
AMPHETAMINE/METH: NEGATIVE ng/mL
BARBITURATE SCREEN, URINE: NEGATIVE ng/mL
BENZODIAZEPINE SCREEN, URINE: NEGATIVE ng/mL
Buprenorphine, Urine: NEGATIVE ng/mL
CANNABINOID SCRN UR: NEGATIVE ng/mL
Carisoprodol, Urine: NEGATIVE ng/mL
Cocaine Metabolites: NEGATIVE ng/mL
Creatinine, Urine: 145.88 mg/dL (ref >20.0)
Fentanyl, Ur: NEGATIVE ng/mL
MDMA URINE: NEGATIVE ng/mL
Meperidine, Ur: NEGATIVE ng/mL
Methadone Screen, Urine: NEGATIVE ng/mL
Methaqualone: NEGATIVE ng/mL
Nitrites, Initial: NEGATIVE ug/mL
Opiate Screen, Urine: NEGATIVE ng/mL
Oxycodone Screen, Ur: NEGATIVE ng/mL
PH URINE, INITIAL: 5.7 pH (ref 4.5–8.9)
Phencyclidine, Ur: NEGATIVE ng/mL
Propoxyphene: NEGATIVE ng/mL
Tapentadol, urine: NEGATIVE ng/mL
Tramadol Scrn, Ur: NEGATIVE ng/mL
Zolpidem, Urine: NEGATIVE ng/mL

## 2014-11-14 LAB — AFP, QUAD SCREEN
AFP: 43.9 ng/mL
Curr Gest Age: 19.1 wks.days
Down Syndrome Scr Risk Est: 1:26000 {titer}
HCG, Total: 6.46 IU/mL
INH: 83.6 pg/mL
Interpretation-AFP: NEGATIVE
MOM FOR AFP: 0.81
MoM for INH: 0.44
MoM for hCG: 0.25
Open Spina bifida: NEGATIVE
Osb Risk: 1:26500 {titer}
Tri 18 Scr Risk Est: NEGATIVE
UE3 VALUE: 1.38 ng/mL
uE3 Mom: 0.84

## 2014-11-14 LAB — CYTOLOGY - PAP

## 2014-11-16 ENCOUNTER — Encounter (HOSPITAL_COMMUNITY): Payer: Self-pay | Admitting: *Deleted

## 2014-11-16 ENCOUNTER — Ambulatory Visit (HOSPITAL_COMMUNITY)
Admission: RE | Admit: 2014-11-16 | Discharge: 2014-11-16 | Disposition: A | Discharge status: Home / Self Care | Payer: Medicaid Other | Source: Ambulatory Visit | Attending: Obstetrics and Gynecology | Admitting: Obstetrics and Gynecology

## 2014-11-16 ENCOUNTER — Inpatient Hospital Stay (HOSPITAL_COMMUNITY)
Admission: AD | Admit: 2014-11-16 | Discharge: 2014-11-16 | Disposition: A | Discharge status: Home / Self Care | Payer: Medicaid Other | Source: Ambulatory Visit | Attending: Obstetrics & Gynecology | Admitting: Obstetrics & Gynecology

## 2014-11-16 DIAGNOSIS — Z3A20 20 weeks gestation of pregnancy: Secondary | ICD-10-CM | POA: Insufficient documentation

## 2014-11-16 DIAGNOSIS — Z36 Encounter for antenatal screening of mother: Secondary | ICD-10-CM | POA: Diagnosis present

## 2014-11-16 DIAGNOSIS — O9989 Other specified diseases and conditions complicating pregnancy, childbirth and the puerperium: Secondary | ICD-10-CM | POA: Diagnosis not present

## 2014-11-16 DIAGNOSIS — D693 Immune thrombocytopenic purpura: Secondary | ICD-10-CM | POA: Diagnosis not present

## 2014-11-16 DIAGNOSIS — R55 Syncope and collapse: Secondary | ICD-10-CM | POA: Diagnosis present

## 2014-11-16 DIAGNOSIS — Z3482 Encounter for supervision of other normal pregnancy, second trimester: Secondary | ICD-10-CM

## 2014-11-16 DIAGNOSIS — R42 Dizziness and giddiness: Secondary | ICD-10-CM | POA: Diagnosis not present

## 2014-11-16 DIAGNOSIS — O99112 Other diseases of the blood and blood-forming organs and certain disorders involving the immune mechanism complicating pregnancy, second trimester: Secondary | ICD-10-CM | POA: Insufficient documentation

## 2014-11-16 LAB — URINALYSIS, ROUTINE W REFLEX MICROSCOPIC
Bilirubin Urine: NEGATIVE
Glucose, UA: NEGATIVE mg/dL
Ketones, ur: NEGATIVE mg/dL
Nitrite: NEGATIVE
Protein, ur: NEGATIVE mg/dL
SPECIFIC GRAVITY, URINE: 1.01 (ref 1.005–1.030)
Urobilinogen, UA: 0.2 mg/dL (ref 0.0–1.0)
pH: 7.5 (ref 5.0–8.0)

## 2014-11-16 LAB — URINE MICROSCOPIC-ADD ON

## 2014-11-16 NOTE — MAU Note (Signed)
Pt sent from U/S department, pt mentioned during her U/S that she fainted this a.m.  Pt states she feels fine now, doesn't feel she needs to stay.  Pt denies any pain, bleeding, or dizziness @ this time.

## 2014-11-16 NOTE — MAU Note (Signed)
Pt states she did wake up on the floor at work when she fainted.  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  States she had eaten lunch & felt she was well hydrated @ the time.

## 2014-11-16 NOTE — MAU Note (Cosign Needed)
Chief Complaint:  Loss of Consciousness   None    HPI: Dana Gibson is a 34 y.o. G3P0011 at [redacted]w[redacted]d by LMP with a hx of thrombocytopenia presents to maternity admissions due to reporting an episode of syncope earlier in the day.  Pt has had one similar episode earlier this month and states everything is feeling fine other wise.   She has had dizziness since the onset of this pregnancy. Denies contractions, leakage of fluid or vaginal bleeding. Good fetal movement.   Pregnancy Course:   Past Medical History: Past Medical History  Diagnosis Date  . Thrombocytopenia     Past obstetric history: OB History  Gravida Para Term Preterm AB SAB TAB Ectopic Multiple Living  3 1 0  # Outcome Date GA Lbr Len/2nd Weight Sex Delivery Anes PTL Lv  3 Current           2 Para 01/25/04 [redacted]w[redacted]d  3.289 kg (7 lb 4 oz) F Vag-Spont  N Y     Comments: no complications, born in [place]  1 SAB 07/1999 [redacted]w[redacted]d             Past Surgical History: History reviewed. No pertinent past surgical history.   Family History: Family History  Problem Relation Age of Onset  . Hypertension Father     Social History: History  Substance Use Topics  . Smoking status: Never Smoker   . Smokeless tobacco: Never Used  . Alcohol Use: No    Allergies:  Allergies  Allergen Reactions  . Asa [Aspirin] Other (See Comments)    PT HAS THROMBOCYTOPENIA-   ASA   MAKES  HER BLEED MORE    Meds:  Prescriptions prior to admission  Medication Sig Dispense Refill Last Dose  . Prenatal Vit-Fe Fumarate-FA (PRENATAL MULTIVITAMIN) TABS tablet Take 1 tablet by mouth daily at 12 noon.   Taking    ROS:  Review of Systems  Constitutional: Negative for fever and chills.  Eyes: Negative for blurred vision and double vision.  Respiratory: Negative for cough.   Cardiovascular: Negative for chest pain and palpitations.  Gastrointestinal: Positive for nausea. Negative for vomiting, abdominal pain and diarrhea.   Genitourinary: Negative for dysuria and hematuria.  Neurological: Positive for dizziness. Negative for headaches.    Physical Exam  Blood pressure 121/80, pulse 98, temperature 98.1 F (36.7 C), temperature source Oral, resp. rate 18, height 5' 2.5" (1.588 m), weight 61.236 kg (135 lb), last menstrual period 06/29/2014. GENERAL: Well-developed, well-nourished female in no acute distress.  HEART: normal rate, rhythm  RESP: normal effort GI: Abd soft, non-tender, gravid appropriate for gestational age. Pos BS x 4 MS: Extremities nontender, no edema, normal ROM NEURO: Alert and oriented x 4.  GU: NEFG, physiologic discharge, no blood, cervix clean. No CVAT     Labs: Results for orders placed or performed during the hospital encounter of 11/16/14 (from the past 24 hour(s))  Urinalysis, Routine w reflex microscopic     Status: Abnormal   Collection Time: 11/16/14  4:06 PM  Result Value Ref Range   Color, Urine YELLOW YELLOW   APPearance HAZY (A) CLEAR   Specific Gravity, Urine 1.010 1.005 - 1.030   pH 7.5 5.0 - 8.0   Glucose, UA NEGATIVE NEGATIVE mg/dL   Hgb urine dipstick TRACE (A) NEGATIVE   Bilirubin Urine NEGATIVE NEGATIVE   Ketones, ur NEGATIVE NEGATIVE mg/dL   Protein, ur NEGATIVE NEGATIVE mg/dL   Urobilinogen, UA  0.2 0.0 - 1.0 mg/dL   Nitrite NEGATIVE NEGATIVE   Leukocytes, UA LARGE (A) NEGATIVE  Urine microscopic-add on     Status: Abnormal   Collection Time: 11/16/14  4:06 PM  Result Value Ref Range   Squamous Epithelial / LPF FEW (A) RARE   Urine-Other AMORPHOUS URATES/PHOSPHATES     Imaging:  None  MAU Course:  Assessment: 1. Dizziness     Plan: Discharge home in stable condition.  UA was negative for signs of dehydration  Consulted pt on syncope during pregnancy, cautions regarding sudden movements.  Labor precautions and fetal kick counts      Medication List    ASK your doctor about these medications        prenatal multivitamin Tabs tablet   Take 1 tablet by mouth daily at 12 noon.        Kandis FantasiaSenmiao Shawny Borkowski, Med Student 11/16/2014 5:10 PM

## 2014-11-16 NOTE — MAU Provider Note (Signed)
Chief Complaint: Syncope  None   HPI: Dana Gibson Dana Gibson is a 34 y.o. G3P0011 at 4742w0d by LMP with a hx of thrombocytopenia presents to maternity admissions due to reporting an episode of syncope earlier in the day. Pt has had one similar episode earlier this month and states everything is feeling fine other wise. She has had dizziness since the onset of this pregnancy. Denies contractions, leakage of fluid or vaginal bleeding. Good fetal movement.   Pregnancy Course:   Past Medical History: Past Medical History  Diagnosis Date  . Thrombocytopenia     Past obstetric history: OB History  Gravida Para Term Preterm AB SAB TAB Ectopic Multiple Living  3 1 0  1 1    1     # Outcome Date GA Lbr Len/2nd Weight Sex Delivery Anes PTL Lv  3 Current           2 Para 01/25/04 317w3d  3.289 kg (7 lb 4 oz) F Vag-Spont  N Y   Comments: no complications, born in [place]  1 SAB 07/1999 1548w0d             Past Surgical History: History reviewed. No pertinent past surgical history.  Family History: Family History  Problem Relation Age of Onset  . Hypertension Father     Social History: History  Substance Use Topics  . Smoking status: Never Smoker   . Smokeless tobacco: Never Used  . Alcohol Use: No    Allergies:  Allergies  Allergen Reactions  . Asa [Aspirin] Other (See Comments)    PT HAS THROMBOCYTOPENIA- ASA MAKES HER BLEED MORE    Meds:  Prescriptions prior to admission  Medication Sig Dispense Refill Last Dose  . Prenatal Vit-Fe Fumarate-FA (PRENATAL MULTIVITAMIN) TABS tablet Take 1 tablet by mouth daily at 12 noon.   Taking    ROS:  Review of Systems  Constitutional: Negative for fever and chills.  Eyes: Negative for blurred vision and double vision.  Respiratory: Negative for cough.  Cardiovascular: Negative for chest pain and  palpitations.  Gastrointestinal: Positive for nausea. Negative for vomiting, abdominal pain and diarrhea.  Genitourinary: Negative for dysuria and hematuria.  Neurological: Positive for dizziness. Negative for headaches.    Physical Exam  Blood pressure 121/80, pulse 98, temperature 98.1 F (36.7 C), temperature source Oral, resp. rate 18, height 5' 2.5" (1.588 m), weight 61.236 kg (135 lb), last menstrual period 06/29/2014. GENERAL: Well-developed, well-nourished female in no acute distress.  HEART: normal rate, rhythm  RESP: normal effort GI: Abd soft, non-tender, gravid appropriate for gestational age. Pos BS x 4 MS: Extremities nontender, no edema, normal ROM NEURO: Alert and oriented x 4.  GU: NEFG, physiologic discharge, no blood, cervix clean. No CVAT     Labs:  Lab Results Last 24 Hours    Results for orders placed or performed during the hospital encounter of 11/16/14 (from the past 24 hour(s))  Urinalysis, Routine w reflex microscopic Status: Abnormal   Collection Time: 11/16/14 4:06 PM  Result Value Ref Range   Color, Urine YELLOW YELLOW   APPearance HAZY (A) CLEAR   Specific Gravity, Urine 1.010 1.005 - 1.030   pH 7.5 5.0 - 8.0   Glucose, UA NEGATIVE NEGATIVE mg/dL   Hgb urine dipstick TRACE (A) NEGATIVE   Bilirubin Urine NEGATIVE NEGATIVE   Ketones, ur NEGATIVE NEGATIVE mg/dL   Protein, ur NEGATIVE NEGATIVE mg/dL   Urobilinogen, UA 0.2 0.0 - 1.0 mg/dL   Nitrite NEGATIVE NEGATIVE   Leukocytes,  UA LARGE (A) NEGATIVE  Urine microscopic-add on Status: Abnormal   Collection Time: 11/16/14 4:06 PM  Result Value Ref Range   Squamous Epithelial / LPF FEW (A) RARE   Urine-Other AMORPHOUS URATES/PHOSPHATES       Imaging:  None  MAU Course: UA Interpreter present for all conversations Assessment: 1. Dizziness     Plan: Discharge home in stable condition.  UA was negative  for signs of dehydration  Consulted pt on syncope during pregnancy, cautions regarding sudden movements.  Labor precautions and fetal kick counts Pt will be going to Ultrasound after discharge for scheduled anatomy ultrasound      Medication List    ASK your doctor about these medications       prenatal multivitamin Tabs tablet  Take 1 tablet by mouth daily at 12 noon.           Illene Bolus CNM  11/16/2014 @ 5:42

## 2014-11-16 NOTE — Discharge Instructions (Signed)
Mareos °(Dizziness) °Los mareos son un problema muy frecuente. Se trata de una sensación de inestabilidad o aturdimiento. Puede sentir que se va a desvanecer. Puede provocarle una lesión si se tropieza o se cae. Las personas de cualquier edad pueden sufrir mareos, pero es más frecuente en los ancianos. °CAUSAS  °La causa puede deberse a muchos problemas diferentes: °· Problemas en el oído medio. °· Estar de pie durante mucho tiempo. °· Infecciones. °· Reacciones alérgicas. °· Envejecimiento. °· Respuesta emocional a distintas cosas, como por ejemplo la visión de sangre. °· Efectos secundarios de un medicamento. °· Cansancio. °· Problemas circulatorios o de presión arterial. °· Consumo excesivo de alcohol, medicamentos o drogas. °· Respiración muy rápida (hiperventilación). °· Ritmo cardíaco irregular (arritmia). °· Bajo recuento de glóbulos rojos (anemia). °· Embarazo. °· Vómitos, diarrea, fiebre u otras enfermedades que causan la pérdida de líquidos (deshidratación). °· Enfermedades o trastornos, como enfermedad de Parkinson, presión alta (hipertensión arterial), diabetes y problemas tiroideos. °· Exposición al calor extremo. °DIAGNÓSTICO  °El médico le preguntará acerca de los síntomas, y realizará un examen físico y un electrocardiograma (ECG) para registrar la actividad eléctrica del corazón. También podrá realizarle otras pruebas cardíacas o análisis de sangre para determinar la causa de los mareos. Estos pueden ser: °· Ecocardiograma transtorácico (ETT). Durante el ecocardiograma, se usan ondas sonoras para evaluar cómo fluye la sangre por el corazón. °· Ecocardiograma transesofágico (ETE). °· Monitoreo cardíaco. Este estudio permite que el médico controle la frecuencia y el ritmo cardíaco en tiempo real. °· Monitor Holter. Es un dispositivo portátil que registra los latidos cardíacos y ayuda a diagnosticar las arritmias cardíacas. Le permite al médico registrar la actividad cardíaca durante varios días, si es  necesario. °· Pruebas de estrés por ejercicio o por medicamentos que aceleran los latidos cardíacos. °TRATAMIENTO  °El tratamiento de los mareos depende de la causa de los síntomas y puede variar mucho. °INSTRUCCIONES PARA EL CUIDADO EN EL HOGAR  °· Beba suficiente líquido para mantener la orina clara o de color amarillo pálido. Esto es realmente importante cuando el clima es muy caluroso. En los adultos mayores, también es importante cuando hace frío. °· Tome los medicamentos exactamente como se lo hayan indicado, si los mareos se deben a los medicamentos. Cuando tome medicamentos para la presión arterial, es muy importante que se levante lentamente. °¨ Levántese de las sillas con lentitud y apóyese hasta sentirse bien. °¨ Por la mañana, siéntese primero a un lado de la cama. Cuando se sienta bien, póngase lentamente de pie mientras se sostiene de algo, hasta que sepa que ha logrado el equilibrio. °· Mueva las piernas con frecuencia si debe estar de pie en un lugar durante mucho tiempo. Mientras está de pie, contraiga y relaje los músculos de las piernas. °· Pídale a alguna persona que lo acompañe durante 1 o 2 días si los mareos siguen siendo un problema. Debe estar acompañado hasta que se sienta lo suficientemente bien como para estar solo. Pídale a la persona que llame al médico si observa cambios en usted que sean preocupantes. °· No conduzca vehículos ni utilice maquinarias pesadas si se siente mareado. °· No beba alcohol. °SOLICITE ATENCIÓN MÉDICA DE INMEDIATO SI:  °· Los mareos o el aturdimiento empeoran. °· Siente náuseas o vomita. °· Tiene dificultad para hablar, para caminar o para usar los brazos, las manos o las piernas. °· Se siente débil. °· No piensa con claridad o tiene dificultades para armar oraciones. Es posible que un amigo o un familiar adviertan que esto   ocurre. °· Tiene dolor de pecho, dolor abdominal, sudoración o le falta el aire. °· Hay cambios en la visión. °· Observa un  sangrado. °· Tiene efectos secundarios del medicamento que parecen empeorar en lugar de mejorar. °ASEGÚRESE DE QUE:  °· Comprende estas instrucciones. °· Controlará su afección. °· Recibirá ayuda de inmediato si no mejora o si empeora. °Document Released: 08/05/2005 Document Revised: 08/10/2013 °ExitCare® Patient Information ©2015 Exit[employer], LLC. This information is not intended to replace advice given to you by your health care provider. Make sure you discuss any questions you have with your health care provider. ° °

## 2014-11-17 DIAGNOSIS — Z3689 Encounter for other specified antenatal screening: Secondary | ICD-10-CM | POA: Insufficient documentation

## 2014-11-17 DIAGNOSIS — Z3A20 20 weeks gestation of pregnancy: Secondary | ICD-10-CM | POA: Insufficient documentation

## 2014-12-08 ENCOUNTER — Encounter: Payer: Self-pay | Admitting: Family Medicine

## 2014-12-08 ENCOUNTER — Ambulatory Visit (INDEPENDENT_AMBULATORY_CARE_PROVIDER_SITE_OTHER): Payer: Medicaid Other | Admitting: Family Medicine

## 2014-12-08 VITALS — BP 105/70 | HR 95 | Temp 98.3°F | Wt 137.3 lb

## 2014-12-08 DIAGNOSIS — Z3482 Encounter for supervision of other normal pregnancy, second trimester: Secondary | ICD-10-CM

## 2014-12-08 DIAGNOSIS — R319 Hematuria, unspecified: Secondary | ICD-10-CM

## 2014-12-08 LAB — POCT URINALYSIS DIP (DEVICE)
Bilirubin Urine: NEGATIVE
Glucose, UA: NEGATIVE mg/dL
Ketones, ur: 15 mg/dL — AB
Nitrite: NEGATIVE
PROTEIN: NEGATIVE mg/dL
SPECIFIC GRAVITY, URINE: 1.025 (ref 1.005–1.030)
UROBILINOGEN UA: 0.2 mg/dL (ref 0.0–1.0)
pH: 5.5 (ref 5.0–8.0)

## 2014-12-08 NOTE — Progress Notes (Signed)
Spanish interpreter: Dori used Needs anatomy u/s complete--f/u in 1 wk Has hematology appointment in May

## 2014-12-08 NOTE — Progress Notes (Signed)
UA today - small leuks, trace Hgb

## 2014-12-08 NOTE — Progress Notes (Signed)
Dori present for interpreter

## 2014-12-08 NOTE — Patient Instructions (Signed)
Segundo trimestre de embarazo (Second Trimester of Pregnancy) El segundo trimestre va desde la semana13 hasta la 28, desde el cuarto hasta el sexto mes, y suele ser el momento en el que mejor se siente. Su organismo se ha adaptado a estar embarazada y comienza a sentirse fsicamente mejor. En general, las nuseas matutinas han disminuido o han desaparecido completamente, p El segundo trimestre es tambin la poca en la que el feto se desarrolla rpidamente. Hacia el final del sexto mes, el feto mide aproximadamente 9pulgadas (23cm) y pesa alrededor de 1 libras (700g). Es probable que sienta que el beb se mueve (da pataditas) entre las 18 y 20semanas del embarazo. CAMBIOS EN EL ORGANISMO Su organismo atraviesa por muchos cambios durante el embarazo, y estos varan de una mujer a otra.   Seguir aumentando de peso. Notar que la parte baja del abdomen sobresale.  Podrn aparecer las primeras estras en las caderas, el abdomen y las mamas.  Es posible que tenga dolores de cabeza que pueden aliviarse con los medicamentos que su mdico autorice.  Tal vez tenga necesidad de orinar con ms frecuencia porque el feto est ejerciendo presin sobre la vejiga.  Debido al embarazo podr sentir acidez estomacal con frecuencia.  Puede estar estreida, ya que ciertas hormonas enlentecen los movimientos de los msculos que empujan los desechos a travs de los intestinos.  Pueden aparecer hemorroides o abultarse e hincharse las venas (venas varicosas).  Puede tener dolor de espalda que se debe al aumento de peso y a que las hormonas del embarazo relajan las articulaciones entre los huesos de la pelvis, y como consecuencia de la modificacin del peso y los msculos que mantienen el equilibrio.  Las mamas seguirn creciendo y le dolern.  Las encas pueden sangrar y estar sensibles al cepillado y al hilo dental.  Pueden aparecer zonas oscuras o manchas (cloasma, mscara del embarazo) en el rostro que  probablemente se atenuarn despus del nacimiento del beb.  Es posible que se forme una lnea oscura desde el ombligo hasta la zona del pubis (linea nigra) que probablemente se atenuarn despus del nacimiento del beb.  Tal vez haya cambios en el cabello que pueden incluir su engrosamiento, crecimiento rpido y cambios en la textura. Adems, a algunas mujeres se les cae el cabello durante o despus del embarazo, o tienen el cabello seco o fino. Lo ms probable es que el cabello se le normalice despus del nacimiento del beb. QU DEBE ESPERAR EN LAS CONSULTAS PRENATALES Durante una visita prenatal de rutina:  La pesarn para asegurarse de que usted y el feto estn creciendo normalmente.  Le tomarn la presin arterial.  Le medirn el abdomen para controlar el desarrollo del beb.  Se escucharn los latidos cardacos fetales.  Se evaluarn los resultados de los estudios solicitados en visitas anteriores. El mdico puede preguntarle lo siguiente:  Cmo se siente.  Si siente los movimientos del beb.  Si ha tenido sntomas anormales, como prdida de lquido, sangrado, dolores de cabeza intensos o clicos abdominales.  Si tiene alguna pregunta. Otros estudios que podrn realizarse durante el segundo trimestre incluyen lo siguiente:  Anlisis de sangre para detectar:  Concentraciones de hierro bajas (anemia).  Diabetes gestacional (entre la semana 24 y la 28).  Anticuerpos Rh.  Anlisis de orina para detectar infecciones, diabetes o protenas en la orina.  Una ecografa para confirmar que el beb crece y se desarrolla correctamente.  Una amniocentesis para diagnosticar posibles problemas genticos.  Estudios del feto para descartar espina   bfida y sndrome de Down. INSTRUCCIONES PARA EL CUIDADO EN EL HOGAR   Evite fumar, consumir hierbas, beber alcohol y tomar frmacos que no le hayan recetado. Estas sustancias qumicas afectan la formacin y el desarrollo del beb.  Siga  las indicaciones del mdico en relacin con el uso de medicamentos. Durante el embarazo, hay medicamentos que son seguros de tomar y otros que no.  Haga actividad fsica solo en la forma indicada por el mdico. Sentir clicos uterinos es un buen signo para detener la actividad fsica.  Contine comiendo alimentos que sanos con regularidad.  Use un sostn que le brinde buen soporte si le duelen las mamas.  No se d baos de inmersin en agua caliente, baos turcos ni saunas.  Colquese el cinturn de seguridad cuando conduzca.  No coma carne cruda ni queso sin cocinar; evite el contacto con las bandejas sanitarias de los gatos y la tierra que estos animales usan. Estos elementos contienen grmenes que pueden causar defectos congnitos en el beb.  Tome las vitaminas prenatales.  Si est estreida, pruebe un laxante suave (si el mdico lo autoriza). Consuma ms alimentos ricos en fibra, como vegetales y frutas frescos y cereales integrales. Beba gran cantidad de lquido para mantener la orina de tono claro o color amarillo plido.  Dese baos de asiento con agua tibia para aliviar el dolor o las molestias causadas por las hemorroides. Use una crema para las hemorroides si el mdico la autoriza.  Si tiene venas varicosas, use medias de descanso. Eleve los pies durante 15minutos, 3 o 4veces por da. Limite la cantidad de sal en su dieta.  No levante objetos pesados, use zapatos de tacones bajos y mantenga una buena postura.  Descanse con las piernas elevadas si tiene calambres o dolor de cintura.  Visite a su dentista si an no lo ha hecho durante el embarazo. Use un cepillo de dientes blando para higienizarse los dientes y psese el hilo dental con suavidad.  Puede seguir manteniendo relaciones sexuales, a menos que el mdico le indique lo contrario.  Concurra a todas las visitas prenatales segn las indicaciones de su mdico. SOLICITE ATENCIN MDICA SI:   Tiene mareos.  Siente  clicos leves, presin en la pelvis o dolor persistente en el abdomen.  Tiene nuseas, vmitos o diarrea persistentes.  Tiene secrecin vaginal con mal olor.  Siente dolor al orinar. SOLICITE ATENCIN MDICA DE INMEDIATO SI:   Tiene fiebre.  Tiene una prdida de lquido por la vagina.  Tiene sangrado o pequeas prdidas vaginales.  Siente dolor intenso o clicos en el abdomen.  Sube o baja de peso rpidamente.  Tiene dificultad para respirar y siente dolor de pecho.  Sbitamente se le hinchan mucho el rostro, las manos, los tobillos, los pies o las piernas.  No ha sentido los movimientos del beb durante una hora.  Siente un dolor de cabeza intenso que no se alivia con medicamentos.  Hay cambios en la visin. Document Released: 05/15/2005 Document Revised: 08/10/2013 [employer] Patient Information 2015 [employer]. This information is not intended to replace advice given to you by your health care provider. Make sure you discuss any questions you have with your health care provider.  Lactancia materna (Breastfeeding) Decidir amamantar es una de las mejores elecciones que puede hacer por usted y su beb. El cambio hormonal durante el embarazo produce el desarrollo del tejido mamario y aumenta la cantidad y el tamao de los conductos galactforos. Estas hormonas tambin permiten que las protenas, los azcares y   las grasas de la sangre produzcan la leche materna en las glndulas productoras de leche. Las hormonas impiden que la leche materna sea liberada antes del nacimiento del beb, adems de impulsar el flujo de leche luego del nacimiento. Una vez que ha comenzado a amamantar, pensar en el beb, as como la succin o el llanto, pueden estimular la liberacin de leche de las glndulas productoras de leche.  LOS BENEFICIOS DE AMAMANTAR Para el beb  La primera leche (calostro) ayuda a mejorar el funcionamiento del sistema digestivo del beb.  La leche tiene anticuerpos que  ayudan a prevenir las infecciones en el beb.  El beb tiene una menor incidencia de asma, alergias y del sndrome de muerte sbita del lactante.  Los nutrientes en la leche materna son mejores para el beb que la leche maternizada y estn preparados exclusivamente para cubrir las necesidades del beb.  La leche materna mejora el desarrollo cerebral del beb.  Es menos probable que el beb desarrolle otras enfermedades, como obesidad infantil, asma o diabetes mellitus de tipo 2. Para usted   La lactancia materna favorece el desarrollo de un vnculo muy especial entre la madre y el beb.  Es conveniente. La leche materna siempre est disponible a la temperatura correcta y es econmica.  La lactancia materna ayuda a quemar caloras y a perder el peso ganado durante el embarazo.  Favorece la contraccin del tero al tamao que tena antes del embarazo de manera ms rpida y disminuye el sangrado (loquios) despus del parto.  La lactancia materna contribuye a reducir el riesgo de desarrollar diabetes mellitus de tipo 2, osteoporosis o cncer de mama o de ovario en el futuro. SIGNOS DE QUE EL BEB EST HAMBRIENTO Primeros signos de hambre  Aumenta su estado de alerta o actividad.  Se estira.  Mueve la cabeza de un lado a otro.  Mueve la cabeza y abre la boca cuando se le toca la mejilla o la comisura de la boca (reflejo de bsqueda).  Aumenta las vocalizaciones, tales como sonidos de succin, se relame los labios, emite arrullos, suspiros, o chirridos.  Mueve la mano hacia la boca.  Se chupa con ganas los dedos o las manos. Signos tardos de hambre  Est agitado.  Llora de manera intermitente. Signos de hambre extrema Los signos de hambre extrema requerirn que lo calme y lo consuele antes de que el beb pueda alimentarse adecuadamente. No espere a que se manifiesten los siguientes signos de hambre extrema para comenzar a amamantar:   Agitacin.  Llanto intenso y fuerte.    Gritos. INFORMACIN BSICA SOBRE LA LACTANCIA MATERNA Iniciacin de la lactancia materna  Encuentre un lugar cmodo para sentarse o acostarse, con un buen respaldo para el cuello y la espalda.  Coloque una almohada o una manta enrollada debajo del beb para acomodarlo a la altura de la mama (si est sentada). Las almohadas para amamantar se han diseado especialmente a fin de servir de apoyo para los brazos y el beb mientras amamanta.  Asegrese de que el abdomen del beb est frente al suyo.  Masajee suavemente la mama. Con las yemas de los dedos, masajee la pared del pecho hacia el pezn en un movimiento circular. Esto estimula el flujo de leche. Es posible que deba continuar este movimiento mientras amamanta si la leche fluye lentamente.  Sostenga la mama con el pulgar por arriba del pezn y los otros 4 dedos por debajo de la mama. Asegrese de que los dedos se encuentren lejos del pezn   y de la boca del beb.  Empuje suavemente los labios del beb con el pezn o con el dedo.  Cuando la boca del beb se abra lo suficiente, acrquelo rpidamente a la mama e introduzca todo el pezn y la zona oscura que lo rodea (areola), tanto como sea posible, dentro de la boca del beb.  Debe haber ms areola visible por arriba del labio superior del beb que por debajo del labio inferior.  La lengua del beb debe estar entre la enca inferior y la mama.  Asegrese de que la boca del beb est en la posicin correcta alrededor del pezn (prendida). Los labios del beb deben crear un sello sobre la mama y estar doblados hacia afuera (invertidos).  Es comn que el beb succione durante 2 a 3 minutos para que comience el flujo de leche materna. Cmo debe prenderse Es muy importante que le ensee al beb cmo prenderse adecuadamente a la mama. Si el beb no se prende adecuadamente, puede causarle dolor en el pezn y reducir la produccin de leche materna, y hacer que el beb tenga un escaso aumento de  peso. Adems, si el beb no se prende adecuadamente al pezn, puede tragar aire durante la alimentacin. Esto puede causarle molestias al beb. Hacer eructar al beb al cambiar de mama puede ayudarlo a liberar el aire. Sin embargo, ensearle al beb cmo prenderse a la mama adecuadamente es la mejor manera de evitar que se sienta molesto por tragar aire mientras se alimenta. Signos de que el beb se ha prendido adecuadamente al pezn:   Tironea o succiona de modo silencioso, sin causarle dolor.  Se escucha que traga cada 3 o 4 succiones.   Hay movimientos musculares por arriba y por delante de sus odos al succionar. Signos de que el beb no se ha prendido adecuadamente al pezn:   Hace ruidos de succin o de chasquido mientras se alimenta.  Siente dolor en el pezn. Si cree que el beb no se prendi correctamente, deslice el dedo en la comisura de la boca y colquelo entre las encas del beb para interrumpir la succin. Intente comenzar a amamantar nuevamente. Signos de lactancia materna exitosa Signos del beb:   Disminuye gradualmente el nmero de succiones o cesa la succin por completo.  Se duerme.  Relaja el cuerpo.  Retiene una pequea cantidad de leche en la boca.  Se desprende solo del pecho. Signos que presenta usted:  Las mamas han aumentado la firmeza, el peso y el tamao 1 a 3 horas despus de amamantar.  Estn ms blandas inmediatamente despus de amamantar.  Un aumento del volumen de leche, y tambin un cambio en su consistencia y color se producen hacia el quinto da de lactancia materna.  Los pezones no duelen, ni estn agrietados ni sangran. Signos de que su beb recibe la cantidad de leche suficiente  Moja al menos 3 paales en 24 horas. La orina debe ser clara y de color amarillo plido a los 5 das de vida.  Defeca al menos 3 veces en 24 horas a los 5 das de vida. La materia fecal debe ser blanda y amarillenta.  Defeca al menos 3 veces en 24 horas a los  7 das de vida. La materia fecal debe ser grumosa y amarillenta.  No registra una prdida de peso mayor del 10% del peso al nacer durante los primeros 3 das de vida.  Aumenta de peso un promedio de 4 a 7onzas (113 a 198g) por semana despus de   los 4 das de vida.  Aumenta de peso, diariamente, de manera uniforme a partir de los 5 das de vida, sin registrar prdida de peso despus de las 2semanas de vida. Despus de alimentarse, es posible que el beb regurgite una pequea cantidad. Esto es frecuente. FRECUENCIA Y DURACIN DE LA LACTANCIA MATERNA El amamantamiento frecuente la ayudar a producir ms leche y a prevenir problemas de dolor en los pezones e hinchazn en las mamas. Alimente al beb cuando muestre signos de hambre o si siente la necesidad de reducir la congestin de las mamas. Esto se denomina "lactancia a demanda". Evite el uso del chupete mientras trabaja para establecer la lactancia (las primeras 4 a 6 semanas despus del nacimiento del beb). Despus de este perodo, podr ofrecerle un chupete. Las investigaciones demostraron que el uso del chupete durante el primer ao de vida del beb disminuye el riesgo de desarrollar el sndrome de muerte sbita del lactante (SMSL). Permita que el nio se alimente en cada mama todo lo que desee. Contine amamantando al beb hasta que haya terminado de alimentarse. Cuando el beb se desprende o se queda dormido mientras se est alimentando de la primera mama, ofrzcale la segunda. Debido a que, con frecuencia, los recin nacidos permanecen somnolientos las primeras semanas de vida, es posible que deba despertar al beb para alimentarlo. Los horarios de lactancia varan de un beb a otro. Sin embargo, las siguientes reglas pueden servir como gua para ayudarla a garantizar que el beb se alimenta adecuadamente:  Se puede amamantar a los recin nacidos (bebs de 4 semanas o menos de vida) cada 1 a 3 horas.  No deben transcurrir ms de 3 horas  durante el da o 5 horas durante la noche sin que se amamante a los recin nacidos.  Debe amamantar al beb 8 veces como mnimo en un perodo de 24 horas, hasta que comience a introducir slidos en su dieta, a los 6 meses de vida aproximadamente. EXTRACCIN DE LECHE MATERNA La extraccin y el almacenamiento de la leche materna le permiten asegurarse de que el beb se alimente exclusivamente de leche materna, aun en momentos en los que no puede amamantar. Esto tiene especial importancia si debe regresar al trabajo en el perodo en que an est amamantando o si no puede estar presente en los momentos en que el beb debe alimentarse. Su asesor en lactancia puede orientarla sobre cunto tiempo es seguro almacenar leche materna.  El sacaleche es un aparato que le permite extraer leche de la mama a un recipiente estril. Luego, la leche materna extrada puede almacenarse en un refrigerador o congelador. Algunos sacaleches son manuales, mientras que otros son elctricos. Consulte a su asesor en lactancia qu tipo ser ms conveniente para usted. Los sacaleches se pueden comprar; sin embargo, algunos hospitales y grupos de apoyo a la lactancia materna alquilan sacaleches mensualmente. Un asesor en lactancia puede ensearle cmo extraer leche materna manualmente, en caso de que prefiera no usar un sacaleche.  CMO CUIDAR LAS MAMAS DURANTE LA LACTANCIA MATERNA Los pezones se secan, agrietan y duelen durante la lactancia materna. Las siguientes recomendaciones pueden ayudarla a mantener las mamas humectadas y sanas:  Evite usar jabn en los pezones.  Use un sostn de soporte. Aunque no son esenciales, las camisetas sin mangas o los sostenes especiales para amamantar estn diseados para acceder fcilmente a las mamas, para amamantar sin tener que quitarse todo el sostn o la camiseta. Evite usar sostenes con aro o sostenes muy ajustados.  Seque al   aire sus pezones durante 3 a 4minutos despus de amamantar al  beb.  Utilice solo apsitos de algodn en el sostn para absorber las prdidas de leche. La prdida de un poco de leche materna entre las tomas es normal.  Utilice lanolina sobre los pezones luego de amamantar. La lanolina ayuda a mantener la humedad normal de la piel. Si usa lanolina pura, no tiene que lavarse los pezones antes de volver a alimentar al beb. La lanolina pura no es txica para el beb. Adems, puede extraer manualmente algunas gotas de leche materna y masajear suavemente esa leche sobre los pezones, para que la leche se seque al aire. Durante las primeras semanas despus de dar a luz, algunas mujeres pueden experimentar hinchazn en las mamas (congestin mamaria). La congestin puede hacer que sienta las mamas pesadas, calientes y sensibles al tacto. El pico de la congestin ocurre dentro de los 3 a 5 das despus del parto. Las siguientes recomendaciones pueden ayudarla a aliviar la congestin:  Vace por completo las mamas al amamantar o extraer leche. Puede aplicar calor hmedo en las mamas (en la ducha o con toallas hmedas para manos) antes de amamantar o extraer leche. Esto aumenta la circulacin y ayuda a que la leche fluya. Si el beb no vaca por completo las mamas cuando lo amamanta, extraiga la leche restante despus de que haya finalizado.  Use un sostn ajustado (para amamantar o comn) o una camiseta sin mangas durante 1 o 2 das para indicar al cuerpo que disminuya ligeramente la produccin de leche.  Aplique compresas de hielo sobre las mamas, a menos que le resulte demasiado incmodo.  Asegrese de que el beb est prendido y se encuentre en la posicin correcta mientras lo alimenta. Si la congestin persiste luego de 48 horas o despus de seguir estas recomendaciones, comunquese con su mdico o un asesor en lactancia. RECOMENDACIONES GENERALES PARA EL CUIDADO DE LA SALUD DURANTE LA LACTANCIA MATERNA  Consuma alimentos saludables. Alterne comidas y colaciones, y  coma 3 de cada una por da. Dado que lo que come afecta la leche materna, es posible que algunas comidas hagan que su beb se vuelva ms irritable de lo habitual. Evite comer este tipo de alimentos si percibe que afectan de manera negativa al beb.  Beba leche, jugos de fruta y agua para satisfacer su sed (aproximadamente 10 vasos al da).  Descanse con frecuencia, reljese y tome sus vitaminas prenatales para evitar la fatiga, el estrs y la anemia.  Contine con los autocontroles de la mama.  Evite masticar y fumar tabaco.  Evite el consumo de alcohol y drogas. Algunos medicamentos, que pueden ser perjudiciales para el beb, pueden pasar a travs de la leche materna. Es importante que consulte a su mdico antes de tomar cualquier medicamento, incluidos todos los medicamentos recetados y de venta libre, as como los suplementos vitamnicos y herbales. Puede quedar embarazada durante la lactancia. Si desea controlar la natalidad, consulte a su mdico cules son las opciones ms seguras para el beb. SOLICITE ATENCIN MDICA SI:   Usted siente que quiere dejar de amamantar o se siente frustrada con la lactancia.  Siente dolor en las mamas o en los pezones.  Sus pezones estn agrietados o sangran.  Sus pechos estn irritados, sensibles o calientes.  Tiene un rea hinchada en cualquiera de las mamas.  Siente escalofros o fiebre.  Tiene nuseas o vmitos.  Presenta una secrecin de otro lquido distinto de la leche materna de los pezones.  Sus mamas   no se llenan antes de amamantar al beb para el quinto da despus del parto.  Se siente triste y deprimida.  El beb est demasiado somnoliento como para comer bien.  El beb tiene problemas para dormir.  Moja menos de 3 paales en 24 horas.  Defeca menos de 3 veces en 24 horas.  La piel del beb o la parte blanca de los ojos se vuelven amarillentas.  El beb no ha aumentado de peso a los 5 das de vida. SOLICITE ATENCIN  MDICA DE INMEDIATO SI:   El beb est muy cansado (letargo) y no se quiere despertar para comer.  Le sube la fiebre sin causa. Document Released: 08/05/2005 Document Revised: 08/10/2013 [employer] Patient Information 2015 [employer]. This information is not intended to replace advice given to you by your health care provider. Make sure you discuss any questions you have with your health care provider.  

## 2014-12-09 LAB — CULTURE, OB URINE
Colony Count: NO GROWTH
Organism ID, Bacteria: NO GROWTH

## 2014-12-14 ENCOUNTER — Ambulatory Visit (HOSPITAL_COMMUNITY)
Admission: RE | Admit: 2014-12-14 | Discharge: 2014-12-14 | Disposition: A | Discharge status: Home / Self Care | Payer: Medicaid Other | Source: Ambulatory Visit | Attending: Family Medicine | Admitting: Family Medicine

## 2014-12-14 DIAGNOSIS — Z36 Encounter for antenatal screening of mother: Secondary | ICD-10-CM | POA: Diagnosis not present

## 2014-12-14 DIAGNOSIS — Z3A24 24 weeks gestation of pregnancy: Secondary | ICD-10-CM | POA: Insufficient documentation

## 2014-12-14 DIAGNOSIS — Z3482 Encounter for supervision of other normal pregnancy, second trimester: Secondary | ICD-10-CM

## 2014-12-21 ENCOUNTER — Telehealth: Payer: Self-pay | Admitting: Hematology and Oncology

## 2014-12-21 ENCOUNTER — Ambulatory Visit: Payer: Self-pay | Admitting: Oncology

## 2014-12-21 ENCOUNTER — Other Ambulatory Visit: Payer: Self-pay

## 2014-12-21 ENCOUNTER — Ambulatory Visit: Payer: Self-pay

## 2014-12-21 NOTE — Telephone Encounter (Signed)
new pt appt-s/w patient and gve appt for 05/05 @ 3:30 w/Dr. Pamelia HoitGudena Referring Dr. Catalina AntiguaPeggy Constant Dx-Hx of bleeding disorder w/pregnancy

## 2014-12-22 ENCOUNTER — Ambulatory Visit: Payer: Medicaid Other

## 2014-12-22 ENCOUNTER — Encounter: Payer: Self-pay | Admitting: Hematology and Oncology

## 2014-12-22 ENCOUNTER — Telehealth: Payer: Self-pay | Admitting: Hematology and Oncology

## 2014-12-22 ENCOUNTER — Encounter (INDEPENDENT_AMBULATORY_CARE_PROVIDER_SITE_OTHER): Payer: Self-pay

## 2014-12-22 ENCOUNTER — Ambulatory Visit (HOSPITAL_BASED_OUTPATIENT_CLINIC_OR_DEPARTMENT_OTHER): Payer: Medicaid Other | Admitting: Hematology and Oncology

## 2014-12-22 VITALS — BP 125/75 | HR 99 | Temp 98.1°F | Resp 18 | Ht 62.5 in | Wt 138.9 lb

## 2014-12-22 DIAGNOSIS — D691 Qualitative platelet defects: Secondary | ICD-10-CM

## 2014-12-22 NOTE — Progress Notes (Signed)
[facility] CONSULT NOTE  Patient Care Team: Reva Boresanya S Pratt, MD as PCP - General (Obstetrics and Gynecology)  CHIEF COMPLAINTS/PURPOSE OF CONSULTATION:  Platelet function disorder  HISTORY OF PRESENTING ILLNESS:  Dana Gibson 34 y.o. female is here because of a prior history of platelet function disorder. She is now [redacted] weeks pregnant and is under the care of OB/GYN. Patient has a prior history of bleeding disorder. Apparently ever since she was 34 years old she's had excessive bruising and bleeding problems. She had previously been given platelet transfusions along with oral Amicar intermittently. Once she started to have menstrual cycles, she bled profusely for 15 days every month area they had previously given her platelet transfusions during those heavy bleeding episodes as well along with Amicar sometimes intravenously sometimes orally. During the birth of her first child in 2005 she had heavy metal leading post delivery for 3 days. They once again have to give platelets every day along with Amicar. She moved from [place] and is here today to discuss management of her bleeding problem even the fact that she will be delivering in the next two and half months.  She is also had previous problems with nosebleeds, bleeds especially when she has dental work done and also easy bruises on her hands and legs.  I reviewed her records extensively and collaborated the history with the patient.  MEDICAL HISTORY:  Past Medical History  Diagnosis Date  . Thrombocytopenia     SURGICAL HISTORY: History reviewed. No pertinent past surgical history.  SOCIAL HISTORY: History   Social History  . Marital Status: Married    Spouse Name: N/A  . Number of Children: N/A  . Years of Education: N/A   Occupational History  . Not on file.   Social History Main Topics  . Smoking status: Never Smoker   . Smokeless tobacco: Never Used  . Alcohol Use: No  . Drug Use: No  . Sexual  Activity: Yes    Birth Control/ Protection: None   Other Topics Concern  . Not on file   Social History Narrative    FAMILY HISTORY: Family History  Problem Relation Age of Onset  . Hypertension Father     ALLERGIES:  is allergic to asa.  MEDICATIONS:  Current Outpatient Prescriptions  Medication Sig Dispense Refill  . Prenatal Vit-Fe Fumarate-FA (PRENATAL MULTIVITAMIN) TABS tablet Take 1 tablet by mouth daily at 12 noon.     No current facility-administered medications for this visit.    REVIEW OF SYSTEMS:   Constitutional: Denies fevers, chills or abnormal night sweats Eyes: Denies blurriness of vision, double vision or watery eyes Ears, nose, mouth, throat, and face: Denies mucositis or sore throat Respiratory: Denies cough, dyspnea or wheezes Cardiovascular: Denies palpitation, chest discomfort or lower extremity swelling Gastrointestinal:  Denies nausea, heartburn or change in bowel habits Skin: Denies abnormal skin rashes Lymphatics: Denies new lymphadenopathy or easy bruising Neurological:Denies numbness, tingling or new weaknesses Behavioral/Psych: Mood is stable, no new changes  Patient is pregnant All other systems were reviewed with the patient and are negative.  PHYSICAL EXAMINATION: ECOG PERFORMANCE STATUS: 1 - Symptomatic but completely ambulatory  Filed Vitals:   12/22/14 1531  BP: 125/75  Pulse: 99  Temp: 98.1 F (36.7 C)  Resp: 18   Filed Weights   12/22/14 1531  Weight: 138 lb 14.4 oz (63.005 kg)    GENERAL:alert, no distress and comfortable SKIN: skin color, texture, turgor are normal, no rashes or significant lesions  EYES: normal, conjunctiva are pink and non-injected, sclera clear OROPHARYNX:no exudate, no erythema and lips, buccal mucosa, and tongue normal  NECK: supple, thyroid normal size, non-tender, without nodularity LYMPH:  no palpable lymphadenopathy in the cervical, axillary or inguinal LUNGS: clear to auscultation and  percussion with normal breathing effort HEART: regular rate & rhythm and no murmurs and no lower extremity edema ABDOMEN:abdomen soft, non-tender and normal bowel sounds Musculoskeletal:no cyanosis of digits and no clubbing  PSYCH: alert & oriented x 3 with fluent speech NEURO: no focal motor/sensory deficits  LABORATORY DATA:  I have reviewed the data as listed Lab Results  Component Value Date   WBC 7.2 11/10/2014   HGB 12.4 11/10/2014   HCT 36.0 11/10/2014   MCV 80.2 11/10/2014   PLT 270 11/10/2014   Lab Results  Component Value Date   NA 138 10/18/2014   K 3.8 10/18/2014   CL 104 10/18/2014   CO2 25 10/18/2014    ASSESSMENT AND PLAN:  Thrombasthenia: Platelet dysfunction ([redacted] week pregnant) Patient history goes back to and she was 34-year-old with complaints of excessive bruising and then when she was 34 years old she had heavy menstrual bleeding ever since 10-15 days. During her first childbirth she bled for 3 days. She had previously been given platelets with episodes of severe bleeding as well as EACA (synthetic inhibitor of Plasma- plasminogen system, anti-fibrinolytic agent)  Plan: 1. Obtain records from [place] regarding her previous workup diagnosis and treatments 2. If no records are unobtainable, we will then bring her back in to do blood work including platelet function assay, von Willebrand factor, PT/PTT INR, electron microscopy of platelets to evaluate for platelet storage pool disorders.  I would like to see her back in 2 months to review the results of blood tests. If in the interim patient delivers or requires assistance with bleeding problems, the treatment plan I would recommend is  1. Give platelet transfusions daily for 5 days 1 unit single donor platelets 2. EACA (Amicar) 5 gms IV X 1 then 1 gram PO Q 1 hour until bleeding stops, maximum 13 g per day 3. Alternatively AMICAR continuous infusion 1 g per hour  All questions were answered. The patient knows to  call the clinic with any problems, questions or concerns.    Sabas SousGudena, Rebekkah Powless K, MD 4:26 PM

## 2014-12-22 NOTE — Progress Notes (Signed)
Checked in new pt with no financial concerns at this time.  Pt's ins should pay at 100% but she has my card for any billing questions or concerns.

## 2014-12-22 NOTE — Assessment & Plan Note (Signed)
Thrombocytopenia: Platelet dysfunction ([redacted] week pregnant) Patient history goes back to and she was 34-year-old with complaints of excessive bruising and then when she was 34 years old she had heavy menstrual bleeding ever since 10-15 days. During her first childbirth she bled for 3 days. She had previously been given platelets with episodes of severe bleeding as well as EACA (synthetic inhibitor of Plasma- plasminogen system, anti-fibrinolytic agent)  Plan: 1. Obtain records from [place] regarding her previous workup diagnosis and treatments 2. If no records are unobtainable, we will then bring her back in to do blood work including platelet function assay, von Willebrand factor, PT/PTT INR, electron microscopy of platelets to evaluate for platelet storage pool disorders.  I would like to see her back in 2 months to review the results of blood tests. If in the interim patient delivers or requires assistance with bleeding problems, the treatment plan I would recommend is  1. Give platelet transfusions daily for 5 days 1 unit single donor platelets 2. EACA (Amicar) 5 gms IV X 1 then 1 gram PO Q 1 hour until bleeding stops, maximum 13 g per day 3. Alternatively AMICAR continuous infusion 1 g per hour

## 2014-12-22 NOTE — Telephone Encounter (Signed)
Appointments made and avs pritned for patient °

## 2015-01-05 ENCOUNTER — Ambulatory Visit (INDEPENDENT_AMBULATORY_CARE_PROVIDER_SITE_OTHER): Payer: Medicaid Other | Admitting: Family

## 2015-01-05 VITALS — BP 130/79 | HR 102 | Wt 142.8 lb

## 2015-01-05 DIAGNOSIS — Z3482 Encounter for supervision of other normal pregnancy, second trimester: Secondary | ICD-10-CM | POA: Diagnosis present

## 2015-01-05 DIAGNOSIS — Z23 Encounter for immunization: Secondary | ICD-10-CM

## 2015-01-05 LAB — POCT URINALYSIS DIP (DEVICE)
Bilirubin Urine: NEGATIVE
GLUCOSE, UA: NEGATIVE mg/dL
Ketones, ur: NEGATIVE mg/dL
NITRITE: NEGATIVE
PROTEIN: NEGATIVE mg/dL
SPECIFIC GRAVITY, URINE: 1.015 (ref 1.005–1.030)
Urobilinogen, UA: 0.2 mg/dL (ref 0.0–1.0)
pH: 6.5 (ref 5.0–8.0)

## 2015-01-05 LAB — CBC
HEMATOCRIT: 33.4 % — AB (ref 36.0–46.0)
Hemoglobin: 11.2 g/dL — ABNORMAL LOW (ref 12.0–15.0)
MCH: 28.1 pg (ref 26.0–34.0)
MCHC: 33.5 g/dL (ref 30.0–36.0)
MCV: 83.7 fL (ref 78.0–100.0)
MPV: 9.3 fL (ref 8.6–12.4)
Platelets: 231 10*3/uL (ref 150–400)
RBC: 3.99 MIL/uL (ref 3.87–5.11)
RDW: 15 % (ref 11.5–15.5)
WBC: 7.6 10*3/uL (ref 4.0–10.5)

## 2015-01-05 MED ORDER — TETANUS-DIPHTH-ACELL PERTUSSIS 5-2.5-18.5 LF-MCG/0.5 IM SUSP
0.5000 mL | Freq: Once | INTRAMUSCULAR | Status: AC
  Administered 2015-01-05: 0.5 mL via INTRAMUSCULAR

## 2015-01-05 MED ORDER — CYCLOBENZAPRINE HCL 10 MG PO TABS: 10.0000 mg | ORAL_TABLET | Freq: Every evening | ORAL | Status: DC | PRN

## 2015-01-05 NOTE — Progress Notes (Signed)
Spanish interpreter Dori. Moderate Hemoglobin and small leukocytes in urine.

## 2015-01-05 NOTE — Progress Notes (Signed)
Reports pain on right side of back that radiates to front of abdomen.  Feels like a contraction.  Pain is intermittent in nature.  No report of pain at this time.  Cervix - closed.  Reviewed preterm labor signs.  Denies vaginal bleeding or leaking of fluid.  Also report suprapubic pain.  RX Flagyl.  No UTI symptoms.  Third trimester labs obtained today (1 hr, RPR, CBC, HIV).

## 2015-01-06 LAB — RPR

## 2015-01-06 LAB — GLUCOSE TOLERANCE, 1 HOUR (50G) W/O FASTING: Glucose, 1 Hour GTT: 112 mg/dL (ref 70–140)

## 2015-01-06 LAB — HIV ANTIBODY (ROUTINE TESTING W REFLEX): HIV 1&2 Ab, 4th Generation: NONREACTIVE

## 2015-01-17 ENCOUNTER — Inpatient Hospital Stay (HOSPITAL_COMMUNITY)
Admission: AD | Admit: 2015-01-17 | Discharge: 2015-01-18 | Disposition: A | Discharge status: Home / Self Care | Payer: Medicaid Other | Source: Ambulatory Visit | Attending: Family Medicine | Admitting: Family Medicine

## 2015-01-17 ENCOUNTER — Encounter (HOSPITAL_COMMUNITY): Payer: Self-pay | Admitting: *Deleted

## 2015-01-17 DIAGNOSIS — O26893 Other specified pregnancy related conditions, third trimester: Secondary | ICD-10-CM

## 2015-01-17 DIAGNOSIS — Z3A28 28 weeks gestation of pregnancy: Secondary | ICD-10-CM | POA: Insufficient documentation

## 2015-01-17 DIAGNOSIS — N898 Other specified noninflammatory disorders of vagina: Secondary | ICD-10-CM | POA: Diagnosis not present

## 2015-01-17 DIAGNOSIS — O9989 Other specified diseases and conditions complicating pregnancy, childbirth and the puerperium: Secondary | ICD-10-CM | POA: Diagnosis not present

## 2015-01-17 DIAGNOSIS — Z3A29 29 weeks gestation of pregnancy: Secondary | ICD-10-CM | POA: Diagnosis not present

## 2015-01-17 LAB — URINALYSIS, ROUTINE W REFLEX MICROSCOPIC
Bilirubin Urine: NEGATIVE
Glucose, UA: NEGATIVE mg/dL
HGB URINE DIPSTICK: NEGATIVE
Ketones, ur: NEGATIVE mg/dL
Nitrite: NEGATIVE
PH: 6 (ref 5.0–8.0)
PROTEIN: NEGATIVE mg/dL
Specific Gravity, Urine: 1.02 (ref 1.005–1.030)
Urobilinogen, UA: 0.2 mg/dL (ref 0.0–1.0)

## 2015-01-17 LAB — URINE MICROSCOPIC-ADD ON

## 2015-01-17 LAB — WET PREP, GENITAL
TRICH WET PREP: NONE SEEN
YEAST WET PREP: NONE SEEN

## 2015-01-17 NOTE — MAU Note (Signed)
Pt reports she has been leaking fluid x 1.5 weeks. Also reports a little pain in her right lower back today.

## 2015-01-17 NOTE — MAU Provider Note (Signed)
History   CSN: [id number]  Arrival date and time: 01/17/15 2139  CC: Leakage of fluid  HPI  Patient is 34 y.o. [id number] [redacted]w[redacted]d here with complaints of several days of leakage. States that fluid is clear and thin. She does not believe it is urine. Unsure if it is associated with any odor. States that whatever she wears gets wet by the end of day. Feels as though the amount of fluid has increased since yesterday. Endorses contractions that are irregular. She says they come and go and she is unable to time them. Patient did have intercourse last night.   +FM, denies VB, vaginal discharge.   OB History    Gravida Para Term Preterm AB TAB SAB Ectopic Multiple Living   3 1 0  Past Medical History  Diagnosis Date  . Thrombocytopenia     Past Surgical History  Procedure Laterality Date  . No past surgeries      Family History  Problem Relation Age of Onset  . Hypertension Father     History  Substance Use Topics  . Smoking status: Never Smoker   . Smokeless tobacco: Never Used  . Alcohol Use: No    Allergies:  Allergies  Allergen Reactions  . Asa [Aspirin] Other (See Comments)    PT HAS THROMBOCYTOPENIA-   ASA   MAKES  HER BLEED MORE    Prescriptions prior to admission  Medication Sig Dispense Refill Last Dose  . Prenatal Vit-Fe Fumarate-FA (PRENATAL MULTIVITAMIN) TABS tablet Take 1 tablet by mouth daily at 12 noon.   01/17/2015 at Unknown time  . cyclobenzaprine (FLEXERIL) 10 MG tablet Take 1 tablet (10 mg total) by mouth at bedtime as needed for muscle spasms. 30 tablet 1     Review of Systems  Constitutional: Negative for fever and chills.  Eyes: Negative for blurred vision and double vision.  Respiratory: Negative for shortness of breath.   Cardiovascular: Negative for chest pain and leg swelling.  Gastrointestinal: Negative for nausea, vomiting and constipation.  Genitourinary: Negative for dysuria.  Neurological: Negative for dizziness and  headaches.  Also per HPI  Physical Exam   Blood pressure 112/78, pulse 95, temperature 98.5 F (36.9 C), temperature source Oral, resp. rate 16, height  (1.575 m), weight 145 lb (65.772 kg), last menstrual period 06/29/2014, SpO2 99 %.  Physical Exam  Constitutional: She is oriented to person, place, and time. She appears well-developed and well-nourished. No distress.  HENT:  Head: Normocephalic and atraumatic.  Eyes: EOM are normal.  Cardiovascular: Normal rate, regular rhythm, normal heart sounds and intact distal pulses.   Respiratory: Effort normal and breath sounds normal.  GI: Soft. Bowel sounds are normal. There is no tenderness.  Musculoskeletal: Normal range of motion. She exhibits no edema or tenderness.  Neurological: She is alert and oriented to person, place, and time.  Skin: Skin is warm and dry.  SPE: closed/thick/posterior cervix  MAU Course  Procedures - None  MDM: -NST reassuring and reactive, no contractions appreciated -SPE: no pooling appreciated, mod amount of discharge -Fern negative -UA with moderate leukocytes, otherwise unremarkable -wet prep with few clue and WBC cells -GC/Ch pending  Assessment and Plan  Patient is 34 y.o. [id number] [redacted]w[redacted]d reporting leakage of fluid likely secondary to increased vaginal discharge and recent intercourse. No signs of PPROM or infection. - fetal kick counts reinforced - preterm labor precautions - f/u with OB provider  Caryl Ada,  DO 01/17/2015, 10:33 PM PGY-1, [facility]  I was present for the exam, performed the cervical exam and agree with above.  [place] Zad[place], CNM 01/18/2015 2:46 AM

## 2015-01-18 DIAGNOSIS — Z3A29 29 weeks gestation of pregnancy: Secondary | ICD-10-CM

## 2015-01-18 DIAGNOSIS — O26893 Other specified pregnancy related conditions, third trimester: Secondary | ICD-10-CM

## 2015-01-18 DIAGNOSIS — N898 Other specified noninflammatory disorders of vagina: Secondary | ICD-10-CM

## 2015-01-18 LAB — GC/CHLAMYDIA PROBE AMP (~~LOC~~) NOT AT ARMC
CHLAMYDIA, DNA PROBE: NEGATIVE
NEISSERIA GONORRHEA: NEGATIVE

## 2015-01-19 ENCOUNTER — Ambulatory Visit (INDEPENDENT_AMBULATORY_CARE_PROVIDER_SITE_OTHER): Payer: Medicaid Other | Admitting: Obstetrics & Gynecology

## 2015-01-19 VITALS — BP 116/69 | HR 92 | Temp 98.4°F | Wt 143.0 lb

## 2015-01-19 DIAGNOSIS — Z3482 Encounter for supervision of other normal pregnancy, second trimester: Secondary | ICD-10-CM

## 2015-01-19 LAB — POCT URINALYSIS DIP (DEVICE)
Bilirubin Urine: NEGATIVE
GLUCOSE, UA: NEGATIVE mg/dL
Ketones, ur: NEGATIVE mg/dL
Nitrite: NEGATIVE
PROTEIN: NEGATIVE mg/dL
SPECIFIC GRAVITY, URINE: 1.025 (ref 1.005–1.030)
Urobilinogen, UA: 0.2 mg/dL (ref 0.0–1.0)
pH: 6.5 (ref 5.0–8.0)

## 2015-01-19 NOTE — Progress Notes (Signed)
Used interpreter [employer] Her[employer]. C/o contractions some days. States came to MAU on 01/17/15 because having uc's and some fluid discharge. States still having same fluidy discharge.

## 2015-01-19 NOTE — Progress Notes (Signed)
Subjective:no complaint   Dana Gibson is a 34 y.o. [id number] 6423w1d being seen today for her obstetrical visit.  Patient reports no complaints.  Denies contractions, vaginal bleeding or leaking of fluid.  Reports good fetal movement.  The following portions of the patient's history were reviewed and updated as appropriate: allergies, current medications, past family history, past medical history, past social history, past surgical history and problem list.   Objective:  BP 116/69 mmHg  Pulse 92  Temp(Src) 98.4 F (36.9 C)  Wt 143 lb (64.864 kg)  LMP 06/29/2014 (Exact Date)  FHT: Fetal Heart Rate (bpm): 150  Uterine Size:    Fetal Movement: Movement: Present  Presentation:      Abdomen:  soft, gravid, appropriate for gestational age,non-tender         Results for orders placed or performed in visit on 01/19/15 (from the past 24 hour(s))  POCT urinalysis dip (device)     Status: Abnormal   Collection Time: 01/19/15  9:36 AM  Result Value Ref Range   Glucose, UA NEGATIVE NEGATIVE mg/dL   Bilirubin Urine NEGATIVE NEGATIVE   Ketones, ur NEGATIVE NEGATIVE mg/dL   Specific Gravity, Urine 1.025 1.005 - 1.030   Hgb urine dipstick TRACE (A) NEGATIVE   pH 6.5 5.0 - 8.0   Protein, ur NEGATIVE NEGATIVE mg/dL   Urobilinogen, UA 0.2 0.0 - 1.0 mg/dL   Nitrite NEGATIVE NEGATIVE   Leukocytes, UA TRACE (A) NEGATIVE   CBC    Component Value Date/Time   WBC 7.6 01/05/2015 0902   RBC 3.99 01/05/2015 0902   HGB 11.2* 01/05/2015 0902   HCT 33.4* 01/05/2015 0902   PLT 231 01/05/2015 0902   MCV 83.7 01/05/2015 0902   MCH 28.1 01/05/2015 0902   MCHC 33.5 01/05/2015 0902   RDW 15.0 01/05/2015 0902   LYMPHSABS 1.2 11/10/2014 0930   MONOABS 0.4 11/10/2014 0930   EOSABS 0.1 11/10/2014 0930   BASOSABS 0.0 11/10/2014 0930    Note from Heme onc reviewed would like to see her back in 2 months to review the results of blood tests. If in the interim patient delivers or requires  assistance with bleeding problems, the treatment plan I would recommend is  1. Give platelet transfusions daily for 5 days 1 unit single donor platelets 2. EACA (Amicar) 5 gms IV X 1 then 1 gram PO Q 1 hour until bleeding stops, maximum 13 g per day 3. Alternatively AMICAR continuous infusion 1 g per hour  Assessment and Plan:   Pregnancy:  G3P0011 at 3523w1d  Patient Active Problem List   Diagnosis Date Noted  . 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 Supervision of other normal pregnancy, antepartum 11/10/2014  . History of bleeding disorder 11/10/2014      Preterm labor symptoms: vaginal bleeding, contractions and leaking of fluid reviewed in detail.  Fetal movement precautions reviewed.  Follow up in 2 weeks.  Adam PhenixJames G Hendrixx Severin, MD

## 2015-01-19 NOTE — Patient Instructions (Signed)
Tercer trimestre de embarazo (Third Trimester of Pregnancy) El tercer trimestre va desde la semana29 hasta la 42, desde el sptimo hasta el noveno mes, y es la poca en la que el feto crece ms rpidamente. Hacia el final del noveno mes, el feto mide alrededor de 20pulgadas (45cm) de largo y pesa entre 6 y 10 libras (2,700 y 4,500kg).  CAMBIOS EN EL ORGANISMO Su organismo atraviesa por muchos cambios durante el embarazo, y estos varan de una mujer a otra.   Seguir aumentando de peso. Es de esperar que aumente entre 25 y 35libras (11 y 16kg) hacia el final del embarazo.  Podrn aparecer las primeras estras en las caderas, el abdomen y las mamas.  Puede tener necesidad de orinar con ms frecuencia porque el feto baja hacia la pelvis y ejerce presin sobre la vejiga.  Debido al embarazo podr sentir acidez estomacal con frecuencia.  Puede estar estreida, ya que ciertas hormonas enlentecen los movimientos de los msculos que empujan los desechos a travs de los intestinos.  Pueden aparecer hemorroides o abultarse e hincharse las venas (venas varicosas).  Puede sentir dolor plvico debido al aumento de peso y a que las hormonas del embarazo relajan las articulaciones entre los huesos de la pelvis. El dolor de espalda puede ser consecuencia de la sobrecarga de los msculos que soportan la postura.  Tal vez haya cambios en el cabello que pueden incluir su engrosamiento, crecimiento rpido y cambios en la textura. Adems, a algunas mujeres se les cae el cabello durante o despus del embarazo, o tienen el cabello seco o fino. Lo ms probable es que el cabello se le normalice despus del nacimiento del beb.  Las mamas seguirn creciendo y le dolern. A veces, puede haber una secrecin amarilla de las mamas llamada calostro.  El ombligo puede salir hacia afuera.  Puede sentir que le falta el aire debido a que se expande el tero.  Puede notar que el feto "baja" o lo siente ms bajo, en el  abdomen.  Puede tener una prdida de secrecin mucosa con sangre. Esto suele ocurrir en el trmino de unos pocos das a una semana antes de que comience el trabajo de parto.  El cuello del tero se vuelve delgado y blando (se borra) cerca de la fecha de parto. QU DEBE ESPERAR EN LOS EXMENES PRENATALES  Le harn exmenes prenatales cada 2semanas hasta la semana36. A partir de ese momento le harn exmenes semanales. Durante una visita prenatal de rutina:  La pesarn para asegurarse de que usted y el feto estn creciendo normalmente.  Le tomarn la presin arterial.  Le medirn el abdomen para controlar el desarrollo del beb.  Se escucharn los latidos cardacos fetales.  Se evaluarn los resultados de los estudios solicitados en visitas anteriores.  Le revisarn el cuello del tero cuando est prxima la fecha de parto para controlar si este se ha borrado. Alrededor de la semana36, el mdico le revisar el cuello del tero. Al mismo tiempo, realizar un anlisis de las secreciones del tejido vaginal. Este examen es para determinar si hay un tipo de bacteria, estreptococo Grupo B. El mdico le explicar esto con ms detalle. El mdico puede preguntarle lo siguiente:  Cmo le gustara que fuera el parto.  Cmo se siente.  Si siente los movimientos del beb.  Si ha tenido sntomas anormales, como prdida de lquido, sangrado, dolores de cabeza intensos o clicos abdominales.  Si tiene alguna pregunta. Otros exmenes o estudios de deteccin que pueden realizarse   durante el tercer trimestre incluyen lo siguiente:  Anlisis de sangre para controlar las concentraciones de hierro (anemia).  Controles fetales para determinar su salud, nivel de actividad y crecimiento. Si tiene alguna enfermedad o hay problemas durante el embarazo, le harn estudios. FALSO TRABAJO DE PARTO Es posible que sienta contracciones leves e irregulares que finalmente desaparecen. Se llaman contracciones de  Braxton Hicks o falso trabajo de parto. Las contracciones pueden durar horas, das o incluso semanas, antes de que el verdadero trabajo de parto se inicie. Si las contracciones ocurren a intervalos regulares, se intensifican o se hacen dolorosas, lo mejor es que la revise el mdico.  SIGNOS DE TRABAJO DE PARTO   Clicos de tipo menstrual.  Contracciones cada 5minutos o menos.  Contracciones que comienzan en la parte superior del tero y se extienden hacia abajo, a la zona inferior del abdomen y la espalda.  Sensacin de mayor presin en la pelvis o dolor de espalda.  Una secrecin de mucosidad acuosa o con sangre que sale de la vagina. Si tiene alguno de estos signos antes de la semana37 del embarazo, llame a su mdico de inmediato. Debe concurrir al hospital para que la controlen inmediatamente. INSTRUCCIONES PARA EL CUIDADO EN EL HOGAR   Evite fumar, consumir hierbas, beber alcohol y tomar frmacos que no le hayan recetado. Estas sustancias qumicas afectan la formacin y el desarrollo del beb.  Siga las indicaciones del mdico en relacin con el uso de medicamentos. Durante el embarazo, hay medicamentos que son seguros de tomar y otros que no.  Haga actividad fsica solo en la forma indicada por el mdico. Sentir clicos uterinos es un buen signo para detener la actividad fsica.  Contine comiendo alimentos que sanos con regularidad.  Use un sostn que le brinde buen soporte si le duelen las mamas.  No se d baos de inmersin en agua caliente, baos turcos ni saunas.  Colquese el cinturn de seguridad cuando conduzca.  No coma carne cruda ni queso sin cocinar; evite el contacto con las bandejas sanitarias de los gatos y la tierra que estos animales usan. Estos elementos contienen grmenes que pueden causar defectos congnitos en el beb.  Tome las vitaminas prenatales.  Si est estreida, pruebe un laxante suave (si el mdico lo autoriza). Consuma ms alimentos ricos en  fibra, como vegetales y frutas frescos y cereales integrales. Beba gran cantidad de lquido para mantener la orina de tono claro o color amarillo plido.  Dese baos de asiento con agua tibia para aliviar el dolor o las molestias causadas por las hemorroides. Use una crema para las hemorroides si el mdico la autoriza.  Si tiene venas varicosas, use medias de descanso. Eleve los pies durante 15minutos, 3 o 4veces por da. Limite la cantidad de sal en su dieta.  Evite levantar objetos pesados, use zapatos de tacones bajos y mantenga una buena postura.  Descanse con las piernas elevadas si tiene calambres o dolor de cintura.  Visite a su dentista si no lo ha hecho durante el embarazo. Use un cepillo de dientes blando para higienizarse los dientes y psese el hilo dental con suavidad.  Puede seguir manteniendo relaciones sexuales, a menos que el mdico le indique lo contrario.  No haga viajes largos excepto que sea absolutamente necesario y solo con la autorizacin del mdico.  Tome clases prenatales para entender, practicar y hacer preguntas sobre el trabajo de parto y el parto.  Haga un ensayo de la partida al hospital.  Prepare el bolso que   llevar al hospital.  Prepare la habitacin del beb.  Concurra a todas las visitas prenatales segn las indicaciones de su mdico. SOLICITE ATENCIN MDICA SI:  No est segura de que est en trabajo de parto o de que ha roto la bolsa de las aguas.  Tiene mareos.  Siente clicos leves, presin en la pelvis o dolor persistente en el abdomen.  Tiene nuseas, vmitos o diarrea persistentes.  Tiene secrecin vaginal con mal olor.  Siente dolor al orinar. SOLICITE ATENCIN MDICA DE INMEDIATO SI:   Tiene fiebre.  Tiene una prdida de lquido por la vagina.  Tiene sangrado o pequeas prdidas vaginales.  Siente dolor intenso o clicos en el abdomen.  Sube o baja de peso rpidamente.  Tiene dificultad para respirar y siente dolor de  pecho.  Sbitamente se le hinchan mucho el rostro, las manos, los tobillos, los pies o las piernas.  No ha sentido los movimientos del beb durante una hora.  Siente un dolor de cabeza intenso que no se alivia con medicamentos.  Hay cambios en la visin. Document Released: 05/15/2005 Document Revised: 08/10/2013 [employer] Patient Information 2015 [employer]. This information is not intended to replace advice given to you by your health care provider. Make sure you discuss any questions you have with your health care provider.  

## 2015-02-02 ENCOUNTER — Ambulatory Visit (INDEPENDENT_AMBULATORY_CARE_PROVIDER_SITE_OTHER): Payer: Medicaid Other | Admitting: Family Medicine

## 2015-02-02 VITALS — BP 116/68 | HR 84 | Wt 144.2 lb

## 2015-02-02 DIAGNOSIS — Z3482 Encounter for supervision of other normal pregnancy, second trimester: Secondary | ICD-10-CM

## 2015-02-02 LAB — POCT URINALYSIS DIP (DEVICE)
Bilirubin Urine: NEGATIVE
GLUCOSE, UA: NEGATIVE mg/dL
Hgb urine dipstick: NEGATIVE
Ketones, ur: NEGATIVE mg/dL
NITRITE: NEGATIVE
PH: 7.5 (ref 5.0–8.0)
Protein, ur: NEGATIVE mg/dL
Specific Gravity, Urine: 1.02 (ref 1.005–1.030)
Urobilinogen, UA: 1 mg/dL (ref 0.0–1.0)

## 2015-02-02 NOTE — Progress Notes (Signed)
Pt reports back pain. Large Leukocytes in urine. Reviewed Breastfeeding tip of the week regarding feeding cues, also reviewed benefits to mother of breastfeeding. Spanish interpreter Dori present.

## 2015-02-02 NOTE — Progress Notes (Signed)
Intermittent contractions - every few days, has contraction for 3 hours that are every 3-5 minutes apart.  Cervix closed Labor and fetal movement precautions given

## 2015-02-16 ENCOUNTER — Ambulatory Visit (INDEPENDENT_AMBULATORY_CARE_PROVIDER_SITE_OTHER): Payer: Medicaid Other | Admitting: Family Medicine

## 2015-02-16 VITALS — BP 122/72 | HR 94 | Wt 150.4 lb

## 2015-02-16 DIAGNOSIS — D691 Qualitative platelet defects: Secondary | ICD-10-CM

## 2015-02-16 DIAGNOSIS — Z3483 Encounter for supervision of other normal pregnancy, third trimester: Secondary | ICD-10-CM

## 2015-02-16 LAB — POCT URINALYSIS DIP (DEVICE)
Bilirubin Urine: NEGATIVE
GLUCOSE, UA: NEGATIVE mg/dL
Ketones, ur: NEGATIVE mg/dL
Nitrite: NEGATIVE
PH: 6 (ref 5.0–8.0)
Protein, ur: NEGATIVE mg/dL
Specific Gravity, Urine: 1.025 (ref 1.005–1.030)
Urobilinogen, UA: 0.2 mg/dL (ref 0.0–1.0)

## 2015-02-16 NOTE — Progress Notes (Signed)
Large Leukocytes in urine. Reviewed breast feeding tip of the week with patient.

## 2015-02-16 NOTE — Patient Instructions (Signed)
Tercer trimestre de embarazo (Third Trimester of Pregnancy) El tercer trimestre va desde la semana29 hasta la 42, desde el sptimo hasta el noveno mes, y es la poca en la que el feto crece ms rpidamente. Hacia el final del noveno mes, el feto mide alrededor de 20pulgadas (45cm) de largo y pesa entre 6 y 10 libras (2,700 y 4,500kg).  CAMBIOS EN EL ORGANISMO Su organismo atraviesa por muchos cambios durante el embarazo, y estos varan de una mujer a otra.   Seguir aumentando de peso. Es de esperar que aumente entre 25 y 35libras (11 y 16kg) hacia el final del embarazo.  Podrn aparecer las primeras estras en las caderas, el abdomen y las mamas.  Puede tener necesidad de orinar con ms frecuencia porque el feto baja hacia la pelvis y ejerce presin sobre la vejiga.  Debido al embarazo podr sentir acidez estomacal con frecuencia.  Puede estar estreida, ya que ciertas hormonas enlentecen los movimientos de los msculos que empujan los desechos a travs de los intestinos.  Pueden aparecer hemorroides o abultarse e hincharse las venas (venas varicosas).  Puede sentir dolor plvico debido al aumento de peso y a que las hormonas del embarazo relajan las articulaciones entre los huesos de la pelvis. El dolor de espalda puede ser consecuencia de la sobrecarga de los msculos que soportan la postura.  Tal vez haya cambios en el cabello que pueden incluir su engrosamiento, crecimiento rpido y cambios en la textura. Adems, a algunas mujeres se les cae el cabello durante o despus del embarazo, o tienen el cabello seco o fino. Lo ms probable es que el cabello se le normalice despus del nacimiento del beb.  Las mamas seguirn creciendo y le dolern. A veces, puede haber una secrecin amarilla de las mamas llamada calostro.  El ombligo puede salir hacia afuera.  Puede sentir que le falta el aire debido a que se expande el tero.  Puede notar que el feto "baja" o lo siente ms bajo, en el  abdomen.  Puede tener una prdida de secrecin mucosa con sangre. Esto suele ocurrir en el trmino de unos pocos das a una semana antes de que comience el trabajo de parto.  El cuello del tero se vuelve delgado y blando (se borra) cerca de la fecha de parto. QU DEBE ESPERAR EN LOS EXMENES PRENATALES  Le harn exmenes prenatales cada 2semanas hasta la semana36. A partir de ese momento le harn exmenes semanales. Durante una visita prenatal de rutina:  La pesarn para asegurarse de que usted y el feto estn creciendo normalmente.  Le tomarn la presin arterial.  Le medirn el abdomen para controlar el desarrollo del beb.  Se escucharn los latidos cardacos fetales.  Se evaluarn los resultados de los estudios solicitados en visitas anteriores.  Le revisarn el cuello del tero cuando est prxima la fecha de parto para controlar si este se ha borrado. Alrededor de la semana36, el mdico le revisar el cuello del tero. Al mismo tiempo, realizar un anlisis de las secreciones del tejido vaginal. Este examen es para determinar si hay un tipo de bacteria, estreptococo Grupo B. El mdico le explicar esto con ms detalle. El mdico puede preguntarle lo siguiente:  Cmo le gustara que fuera el parto.  Cmo se siente.  Si siente los movimientos del beb.  Si ha tenido sntomas anormales, como prdida de lquido, sangrado, dolores de cabeza intensos o clicos abdominales.  Si tiene alguna pregunta. Otros exmenes o estudios de deteccin que pueden realizarse   durante el tercer trimestre incluyen lo siguiente:  Anlisis de sangre para controlar las concentraciones de hierro (anemia).  Controles fetales para determinar su salud, nivel de actividad y crecimiento. Si tiene alguna enfermedad o hay problemas durante el embarazo, le harn estudios. FALSO TRABAJO DE PARTO Es posible que sienta contracciones leves e irregulares que finalmente desaparecen. Se llaman contracciones de  Braxton Hicks o falso trabajo de parto. Las contracciones pueden durar horas, das o incluso semanas, antes de que el verdadero trabajo de parto se inicie. Si las contracciones ocurren a intervalos regulares, se intensifican o se hacen dolorosas, lo mejor es que la revise el mdico.  SIGNOS DE TRABAJO DE PARTO   Clicos de tipo menstrual.  Contracciones cada 5minutos o menos.  Contracciones que comienzan en la parte superior del tero y se extienden hacia abajo, a la zona inferior del abdomen y la espalda.  Sensacin de mayor presin en la pelvis o dolor de espalda.  Una secrecin de mucosidad acuosa o con sangre que sale de la vagina. Si tiene alguno de estos signos antes de la semana37 del embarazo, llame a su mdico de inmediato. Debe concurrir al hospital para que la controlen inmediatamente. INSTRUCCIONES PARA EL CUIDADO EN EL HOGAR   Evite fumar, consumir hierbas, beber alcohol y tomar frmacos que no le hayan recetado. Estas sustancias qumicas afectan la formacin y el desarrollo del beb.  Siga las indicaciones del mdico en relacin con el uso de medicamentos. Durante el embarazo, hay medicamentos que son seguros de tomar y otros que no.  Haga actividad fsica solo en la forma indicada por el mdico. Sentir clicos uterinos es un buen signo para detener la actividad fsica.  Contine comiendo alimentos que sanos con regularidad.  Use un sostn que le brinde buen soporte si le duelen las mamas.  No se d baos de inmersin en agua caliente, baos turcos ni saunas.  Colquese el cinturn de seguridad cuando conduzca.  No coma carne cruda ni queso sin cocinar; evite el contacto con las bandejas sanitarias de los gatos y la tierra que estos animales usan. Estos elementos contienen grmenes que pueden causar defectos congnitos en el beb.  Tome las vitaminas prenatales.  Si est estreida, pruebe un laxante suave (si el mdico lo autoriza). Consuma ms alimentos ricos en  fibra, como vegetales y frutas frescos y cereales integrales. Beba gran cantidad de lquido para mantener la orina de tono claro o color amarillo plido.  Dese baos de asiento con agua tibia para aliviar el dolor o las molestias causadas por las hemorroides. Use una crema para las hemorroides si el mdico la autoriza.  Si tiene venas varicosas, use medias de descanso. Eleve los pies durante 15minutos, 3 o 4veces por da. Limite la cantidad de sal en su dieta.  Evite levantar objetos pesados, use zapatos de tacones bajos y mantenga una buena postura.  Descanse con las piernas elevadas si tiene calambres o dolor de cintura.  Visite a su dentista si no lo ha hecho durante el embarazo. Use un cepillo de dientes blando para higienizarse los dientes y psese el hilo dental con suavidad.  Puede seguir manteniendo relaciones sexuales, a menos que el mdico le indique lo contrario.  No haga viajes largos excepto que sea absolutamente necesario y solo con la autorizacin del mdico.  Tome clases prenatales para entender, practicar y hacer preguntas sobre el trabajo de parto y el parto.  Haga un ensayo de la partida al hospital.  Prepare el bolso que   llevar al hospital.  Prepare la habitacin del beb.  Concurra a todas las visitas prenatales segn las indicaciones de su mdico. SOLICITE ATENCIN MDICA SI:  No est segura de que est en trabajo de parto o de que ha roto la bolsa de las aguas.  Tiene mareos.  Siente clicos leves, presin en la pelvis o dolor persistente en el abdomen.  Tiene nuseas, vmitos o diarrea persistentes.  Tiene secrecin vaginal con mal olor.  Siente dolor al orinar. SOLICITE ATENCIN MDICA DE INMEDIATO SI:   Tiene fiebre.  Tiene una prdida de lquido por la vagina.  Tiene sangrado o pequeas prdidas vaginales.  Siente dolor intenso o clicos en el abdomen.  Sube o baja de peso rpidamente.  Tiene dificultad para respirar y siente dolor de  pecho.  Sbitamente se le hinchan mucho el rostro, las manos, los tobillos, los pies o las piernas.  No ha sentido los movimientos del beb durante una hora.  Siente un dolor de cabeza intenso que no se alivia con medicamentos.  Hay cambios en la visin. Document Released: 05/15/2005 Document Revised: 08/10/2013 [employer] Patient Information 2015 [employer]. This information is not intended to replace advice given to you by your health care provider. Make sure you discuss any questions you have with your health care provider.  

## 2015-02-16 NOTE — Progress Notes (Signed)
Subjective:  Dana Gibson is a 34 y.o. G3P0011 at 8870w1d being seen today for ongoing prenatal care.  Patient reports no complaints.  Contractions: Not present.  Vag. Bleeding: None. Movement: Present. Denies leaking of fluid.   The following portions of the patient's history were reviewed and updated as appropriate: allergies, current medications, past family history, past medical history, past social history, past surgical history and problem list.   Objective:   Filed Vitals:   02/16/15 1031  BP: 122/72  Pulse: 94  Weight: 150 lb 6.4 oz (68.221 kg)    Fetal Status: Fetal Heart Rate (bpm): 156   Movement: Present     General:  Alert, oriented and cooperative. Patient is in no acute distress.  Skin: Skin is warm and dry. No rash noted.   Cardiovascular: Normal heart rate noted  Respiratory: Normal respiratory effort, no problems with respiration noted  Abdomen: Soft, gravid, appropriate for gestational age. Pain/Pressure: Absent     Vaginal: Vag. Bleeding: None.       Cervix: Not evaluated        Extremities: Normal range of motion.  Edema: Trace  Mental Status: Normal mood and affect. Normal behavior. Normal judgment and thought content.   Urinalysis: Urine Protein: Negative Urine Glucose: Negative  Assessment and Plan:  Pregnancy: G3P0011 at 2370w1d  1. Thrombasthenia Recommendations from Heme/onc reviewed.    2. Supervision of other normal pregnancy, antepartum, third trimester FHT normal   Preterm labor symptoms and general obstetric precautions including but not limited to vaginal bleeding, contractions, leaking of fluid and fetal movement were reviewed in detail with the patient.  Please refer to After Visit Summary for other counseling recommendations.   Return in about 2 weeks (around 03/02/2015).   Levie HeritageJacob J Sasha Rueth, DO

## 2015-02-21 NOTE — Assessment & Plan Note (Signed)
Patient history goes back to and she was 34-year-old with complaints of excessive bruising and then when she was 34 years old she had heavy menstrual bleeding ever since 10-15 days. During her first childbirth she bled for 3 days. She had previously been given platelets with episodes of severe bleeding as well as EACA (synthetic inhibitor of Plasma- plasminogen system, anti-fibrinolytic agent)  Recommendations from Heme/Onc for intrapartum bleeding control problems 1. Give platelet transfusions daily for 5 days 1 unit single donor platelets 2. EACA (Amicar) 5 gms IV X 1 then 1 gram PO Q 1 hour until bleeding stops, maximum 13 g per day 3. Alternatively AMICAR continuous infusion 1 g per hour

## 2015-02-22 ENCOUNTER — Encounter: Payer: Self-pay | Admitting: Hematology and Oncology

## 2015-02-22 ENCOUNTER — Ambulatory Visit (HOSPITAL_BASED_OUTPATIENT_CLINIC_OR_DEPARTMENT_OTHER): Payer: Medicaid Other | Admitting: Hematology and Oncology

## 2015-02-22 VITALS — BP 134/81 | HR 104 | Temp 98.4°F | Resp 18 | Ht 62.0 in | Wt 150.1 lb

## 2015-02-22 DIAGNOSIS — D691 Qualitative platelet defects: Secondary | ICD-10-CM

## 2015-02-22 NOTE — Progress Notes (Signed)
Patient Care Team: Reva Bores, MD as PCP - General (Obstetrics and Gynecology)  DIAGNOSIS: Thrombasthenia.  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 She was seen by [place] for her current pregnancy. She is now [redacted] weeks pregnant. Pregnancy is being going very well without any complications. She is likely to have normal delivery at [facility]. We requested that her test results from [place] but did not get any such results. She did bring on her phone the results of platelet aggregation study which suggested markedly decreased aggregation ADP, collagen and epinephrine.  REVIEW OF SYSTEMS:   Constitutional: Denies fevers, chills or abnormal weight loss Eyes: Denies blurriness of vision Ears, nose, mouth, throat, and face: Denies mucositis or sore throat Respiratory: Denies cough, dyspnea or wheezes Cardiovascular: Denies palpitation, chest discomfort or lower extremity swelling Gastrointestinal:  Denies nausea, heartburn or change in bowel habits Skin: Denies abnormal skin rashes Lymphatics: Denies new lymphadenopathy or easy bruising Neurological:Denies numbness, tingling or new weaknesses Behavioral/Psych: Mood is stable, no new changes  pregnant lady All other systems were reviewed with the patient and are negative.  I have reviewed the past medical history, past surgical history, social history and family history with the patient and they are unchanged from previous note.  ALLERGIES:  is allergic to asa.  MEDICATIONS:  Current Outpatient Prescriptions  Medication Sig Dispense Refill   Prenatal Vit-Fe Fumarate-FA (PRENATAL MULTIVITAMIN) TABS tablet Take 1 tablet by mouth daily at 12 noon.     No current facility-administered medications for this visit.    PHYSICAL  EXAMINATION: ECOG PERFORMANCE STATUS: 1 - Symptomatic but completely ambulatory  Filed Vitals:   02/22/15 1052  BP: 134/81  Pulse: 104  Temp: 98.4 F (36.9 C)  Resp: 18   Filed Weights   02/22/15 1052  Weight: 150 lb 1.6 oz (68.085 kg)    GENERAL:alert, no distress and comfortable SKIN: skin color, texture, turgor are normal, no rashes or significant lesions EYES: normal, Conjunctiva are pink and non-injected, sclera clear OROPHARYNX:no exudate, no erythema and lips, buccal mucosa, and tongue normal  NECK: supple, thyroid normal size, non-tender, without nodularity LYMPH:  no palpable lymphadenopathy in the cervical, axillary or inguinal LUNGS: clear to auscultation and percussion with normal breathing effort HEART: regular rate & rhythm and no murmurs and no lower extremity edema ABDOMEN: [redacted] weeks pregnant Musculoskeletal:no cyanosis of digits and no clubbing  NEURO: alert & oriented x 3 with fluent speech, no focal motor/sensory deficits  LABORATORY DATA:  I have reviewed the data as listed   Chemistry      Component Value Date/Time   NA 138 10/18/2014 2051   K 3.8 10/18/2014 2051   CL 104 10/18/2014 2051   CO2 25 10/18/2014 2051   BUN 7 10/18/2014 2051   CREATININE 0.35* 10/18/2014 2051      Component Value Date/Time   CALCIUM 8.9 10/18/2014 2051   ALKPHOS 48 10/18/2014 2051   AST 22 10/18/2014 2051   ALT 19 10/18/2014 2051   BILITOT 0.2* 10/18/2014 2051       Lab Results  Component Value Date   WBC 7.6 01/05/2015   HGB 11.2* 01/05/2015   HCT 33.4* 01/05/2015   MCV 83.7 01/05/2015   PLT 231 01/05/2015   NEUTROABS 5.5 11/10/2014   ASSESSMENT & PLAN:  Thrombasthenia Patient history goes back  to and she was 34-year-old with complaints of excessive bruising and then when she was 34 years old she had heavy menstrual bleeding ever since (10-15 days). During her first childbirth she bled for 3 days. She had previously been given platelets with episodes of  severe bleeding as well as EACA (synthetic inhibitor of Plasma- plasminogen system, anti-fibrinolytic agent)  Platelet aggregation study done in [place] showed marked decrease in platelet aggregation to ADP, collagen, epinephrine suggestive of severe platelet dysfunction. I do not have any results from von Willebrand's factor  or electronmicroscopy evaluations.  Recommendations from Heme/Onc for intrapartum bleeding control problems 1. Give platelet transfusions daily for 5 days 1 unit single donor platelets 2. EACA (Amicar) 5 gms IV X 1 then 1 gram PO Q 1 hour until bleeding stops, maximum 13 g per day 3. Alternatively AMICAR continuous infusion 1 g per hour  We will be available for consultation if necessary during her delivery. She will not be given any additional appointments if everything goes well during the delivery process.   No orders of the defined types were placed in this encounter.   The patient has a good understanding of the overall plan. she agrees with it. she will call with any problems that may develop before the next visit here.   Sabas SousGudena, Yisell Sprunger K, MD

## 2015-03-02 ENCOUNTER — Ambulatory Visit (INDEPENDENT_AMBULATORY_CARE_PROVIDER_SITE_OTHER): Payer: Medicaid Other | Admitting: Family Medicine

## 2015-03-02 VITALS — BP 130/82 | HR 92 | Temp 98.3°F | Wt 151.7 lb

## 2015-03-02 DIAGNOSIS — D691 Qualitative platelet defects: Secondary | ICD-10-CM

## 2015-03-02 DIAGNOSIS — Z3483 Encounter for supervision of other normal pregnancy, third trimester: Secondary | ICD-10-CM

## 2015-03-02 LAB — POCT URINALYSIS DIP (DEVICE)
Bilirubin Urine: NEGATIVE
Glucose, UA: NEGATIVE mg/dL
Ketones, ur: NEGATIVE mg/dL
Nitrite: NEGATIVE
PROTEIN: NEGATIVE mg/dL
SPECIFIC GRAVITY, URINE: 1.025 (ref 1.005–1.030)
Urobilinogen, UA: 0.2 mg/dL (ref 0.0–1.0)
pH: 6.5 (ref 5.0–8.0)

## 2015-03-02 NOTE — Progress Notes (Signed)
Subjective:  Dana Gibson [name] is a 34 y.o. G3P0011 at 7274w1d being seen today for ongoing prenatal care.  Patient reports no complaints.  Contractions: Irritability.  Vag. Bleeding: None. Movement: Present. Denies leaking of fluid.   The following portions of the patient's history were reviewed and updated as appropriate: allergies, current medications, past family history, past medical history, past social history, past surgical history and problem list.   Objective:   Filed Vitals:   03/02/15 0953  BP: 130/82  Pulse: 92  Temp: 98.3 F (36.8 C)  Weight: 151 lb 11.2 oz (68.811 kg)    Fetal Status: Fetal Heart Rate (bpm): 150   Movement: Present     General:  Alert, oriented and cooperative. Patient is in no acute distress.  Skin: Skin is warm and dry. No rash noted.   Cardiovascular: Normal heart rate noted  Respiratory: Normal respiratory effort, no problems with respiration noted  Abdomen: Soft, gravid, appropriate for gestational age. Pain/Pressure: Present     Vaginal: Vag. Bleeding: None.       Cervix: Not evaluated        Extremities: Normal range of motion.  Edema: None  Mental Status: Normal mood and affect. Normal behavior. Normal judgment and thought content.   Urinalysis: Urine Protein: Negative Urine Glucose: Negative  Assessment and Plan:  Pregnancy: G3P0011 at 10074w1d  1. Supervision of other normal pregnancy, antepartum, third trimester Continue routine prenatal care. Cultures next week.  2. Thrombasthenia Discussed with pharmacy--they will arrange to have medication here  Term labor symptoms and general obstetric precautions including but not limited to vaginal bleeding, contractions, leaking of fluid and fetal movement were reviewed in detail with the patient. Please refer to After Visit Summary for other counseling recommendations.  Return in 1 week (on 03/09/2015).   Reva Boresanya S Maraki Macquarrie, MD

## 2015-03-02 NOTE — Patient Instructions (Signed)
Tercer trimestre de embarazo (Third Trimester of Pregnancy) El tercer trimestre va desde la semana29 hasta la 42, desde el sptimo hasta el noveno mes, y es la poca en la que el feto crece ms rpidamente. Hacia el final del noveno mes, el feto mide alrededor de 20pulgadas (45cm) de largo y pesa entre 6 y 10 libras (2,700 y 4,500kg).  CAMBIOS EN EL ORGANISMO Su organismo atraviesa por muchos cambios durante el embarazo, y estos varan de una mujer a otra.   Seguir aumentando de peso. Es de esperar que aumente entre 25 y 35libras (11 y 16kg) hacia el final del embarazo.  Podrn aparecer las primeras estras en las caderas, el abdomen y las mamas.  Puede tener necesidad de orinar con ms frecuencia porque el feto baja hacia la pelvis y ejerce presin sobre la vejiga.  Debido al embarazo podr sentir acidez estomacal con frecuencia.  Puede estar estreida, ya que ciertas hormonas enlentecen los movimientos de los msculos que empujan los desechos a travs de los intestinos.  Pueden aparecer hemorroides o abultarse e hincharse las venas (venas varicosas).  Puede sentir dolor plvico debido al aumento de peso y a que las hormonas del embarazo relajan las articulaciones entre los huesos de la pelvis. El dolor de espalda puede ser consecuencia de la sobrecarga de los msculos que soportan la postura.  Tal vez haya cambios en el cabello que pueden incluir su engrosamiento, crecimiento rpido y cambios en la textura. Adems, a algunas mujeres se les cae el cabello durante o despus del embarazo, o tienen el cabello seco o fino. Lo ms probable es que el cabello se le normalice despus del nacimiento del beb.  Las mamas seguirn creciendo y le dolern. A veces, puede haber una secrecin amarilla de las mamas llamada calostro.  El ombligo puede salir hacia afuera.  Puede sentir que le falta el aire debido a que se expande el tero.  Puede notar que el feto "baja" o lo siente ms bajo, en  el abdomen.  Puede tener una prdida de secrecin mucosa con sangre. Esto suele ocurrir en el trmino de unos pocos das a una semana antes de que comience el trabajo de parto.  El cuello del tero se vuelve delgado y blando (se borra) cerca de la fecha de parto. QU DEBE ESPERAR EN LOS EXMENES PRENATALES  Le harn exmenes prenatales cada 2semanas hasta la semana36. A partir de ese momento le harn exmenes semanales. Durante una visita prenatal de rutina:  La pesarn para asegurarse de que usted y el feto estn creciendo normalmente.  Le tomarn la presin arterial.  Le medirn el abdomen para controlar el desarrollo del beb.  Se escucharn los latidos cardacos fetales.  Se evaluarn los resultados de los estudios solicitados en visitas anteriores.  Le revisarn el cuello del tero cuando est prxima la fecha de parto para controlar si este se ha borrado. Alrededor de la semana36, el mdico le revisar el cuello del tero. Al mismo tiempo, realizar un anlisis de las secreciones del tejido vaginal. Este examen es para determinar si hay un tipo de bacteria, estreptococo Grupo B. El mdico le explicar esto con ms detalle. El mdico puede preguntarle lo siguiente:  Cmo le gustara que fuera el parto.  Cmo se siente.  Si siente los movimientos del beb.  Si ha tenido sntomas anormales, como prdida de lquido, sangrado, dolores de cabeza intensos o clicos abdominales.  Si tiene alguna pregunta. Otros exmenes o estudios de deteccin que pueden realizarse   durante el tercer trimestre incluyen lo siguiente:  Anlisis de sangre para controlar las concentraciones de hierro (anemia).  Controles fetales para determinar su salud, nivel de actividad y crecimiento. Si tiene alguna enfermedad o hay problemas durante el embarazo, le harn estudios. FALSO TRABAJO DE PARTO Es posible que sienta contracciones leves e irregulares que finalmente desaparecen. Se llaman contracciones de  Braxton Hicks o falso trabajo de parto. Las contracciones pueden durar horas, das o incluso semanas, antes de que el verdadero trabajo de parto se inicie. Si las contracciones ocurren a intervalos regulares, se intensifican o se hacen dolorosas, lo mejor es que la revise el mdico.  SIGNOS DE TRABAJO DE PARTO   Clicos de tipo menstrual.  Contracciones cada 5minutos o menos.  Contracciones que comienzan en la parte superior del tero y se extienden hacia abajo, a la zona inferior del abdomen y la espalda.  Sensacin de mayor presin en la pelvis o dolor de espalda.  Una secrecin de mucosidad acuosa o con sangre que sale de la vagina. Si tiene alguno de estos signos antes de la semana37 del embarazo, llame a su mdico de inmediato. Debe concurrir al hospital para que la controlen inmediatamente. INSTRUCCIONES PARA EL CUIDADO EN EL HOGAR   Evite fumar, consumir hierbas, beber alcohol y tomar frmacos que no le hayan recetado. Estas sustancias qumicas afectan la formacin y el desarrollo del beb.  Siga las indicaciones del mdico en relacin con el uso de medicamentos. Durante el embarazo, hay medicamentos que son seguros de tomar y otros que no.  Haga actividad fsica solo en la forma indicada por el mdico. Sentir clicos uterinos es un buen signo para detener la actividad fsica.  Contine comiendo alimentos que sanos con regularidad.  Use un sostn que le brinde buen soporte si le duelen las mamas.  No se d baos de inmersin en agua caliente, baos turcos ni saunas.  Colquese el cinturn de seguridad cuando conduzca.  No coma carne cruda ni queso sin cocinar; evite el contacto con las bandejas sanitarias de los gatos y la tierra que estos animales usan. Estos elementos contienen grmenes que pueden causar defectos congnitos en el beb.  Tome las vitaminas prenatales.  Si est estreida, pruebe un laxante suave (si el mdico lo autoriza). Consuma ms alimentos ricos en  fibra, como vegetales y frutas frescos y cereales integrales. Beba gran cantidad de lquido para mantener la orina de tono claro o color amarillo plido.  Dese baos de asiento con agua tibia para aliviar el dolor o las molestias causadas por las hemorroides. Use una crema para las hemorroides si el mdico la autoriza.  Si tiene venas varicosas, use medias de descanso. Eleve los pies durante 15minutos, 3 o 4veces por da. Limite la cantidad de sal en su dieta.  Evite levantar objetos pesados, use zapatos de tacones bajos y mantenga una buena postura.  Descanse con las piernas elevadas si tiene calambres o dolor de cintura.  Visite a su dentista si no lo ha hecho durante el embarazo. Use un cepillo de dientes blando para higienizarse los dientes y psese el hilo dental con suavidad.  Puede seguir manteniendo relaciones sexuales, a menos que el mdico le indique lo contrario.  No haga viajes largos excepto que sea absolutamente necesario y solo con la autorizacin del mdico.  Tome clases prenatales para entender, practicar y hacer preguntas sobre el trabajo de parto y el parto.  Haga un ensayo de la partida al hospital.  Prepare el bolso que   llevar al hospital.  Prepare la habitacin del beb.  Concurra a todas las visitas prenatales segn las indicaciones de su mdico. SOLICITE ATENCIN MDICA SI:  No est segura de que est en trabajo de parto o de que ha roto la bolsa de las aguas.  Tiene mareos.  Siente clicos leves, presin en la pelvis o dolor persistente en el abdomen.  Tiene nuseas, vmitos o diarrea persistentes.  Tiene secrecin vaginal con mal olor.  Siente dolor al orinar. SOLICITE ATENCIN MDICA DE INMEDIATO SI:   Tiene fiebre.  Tiene una prdida de lquido por la vagina.  Tiene sangrado o pequeas prdidas vaginales.  Siente dolor intenso o clicos en el abdomen.  Sube o baja de peso rpidamente.  Tiene dificultad para respirar y siente dolor de  pecho.  Sbitamente se le hinchan mucho el rostro, las manos, los tobillos, los pies o las piernas.  No ha sentido los movimientos del beb durante una hora.  Siente un dolor de cabeza intenso que no se alivia con medicamentos.  Hay cambios en la visin. Document Released: 05/15/2005 Document Revised: 08/10/2013 [employer] Patient Information 2015 [employer]. This information is not intended to replace advice given to you by your health care provider. Make sure you discuss any questions you have with your health care provider.  Lactancia materna (Breastfeeding) Decidir amamantar es una de las mejores elecciones que puede hacer por usted y su beb. El cambio hormonal durante el embarazo produce el desarrollo del tejido mamario y aumenta la cantidad y el tamao de los conductos galactforos. Estas hormonas tambin permiten que las protenas, los azcares y las grasas de la sangre produzcan la leche materna en las glndulas productoras de leche. Las hormonas impiden que la leche materna sea liberada antes del nacimiento del beb, adems de impulsar el flujo de leche luego del nacimiento. Una vez que ha comenzado a amamantar, pensar en el beb, as como la succin o el llanto, pueden estimular la liberacin de leche de las glndulas productoras de leche.  LOS BENEFICIOS DE AMAMANTAR Para el beb  La primera leche (calostro) ayuda a mejorar el funcionamiento del sistema digestivo del beb.  La leche tiene anticuerpos que ayudan a prevenir las infecciones en el beb.  El beb tiene una menor incidencia de asma, alergias y del sndrome de muerte sbita del lactante.  Los nutrientes en la leche materna son mejores para el beb que la leche maternizada y estn preparados exclusivamente para cubrir las necesidades del beb.  La leche materna mejora el desarrollo cerebral del beb.  Es menos probable que el beb desarrolle otras enfermedades, como obesidad infantil, asma o diabetes mellitus de  tipo 2. Para usted   La lactancia materna favorece el desarrollo de un vnculo muy especial entre la madre y el beb.  Es conveniente. La leche materna siempre est disponible a la temperatura correcta y es econmica.  La lactancia materna ayuda a quemar caloras y a perder el peso ganado durante el embarazo.  Favorece la contraccin del tero al tamao que tena antes del embarazo de manera ms rpida y disminuye el sangrado (loquios) despus del parto.  La lactancia materna contribuye a reducir el riesgo de desarrollar diabetes mellitus de tipo 2, osteoporosis o cncer de mama o de ovario en el futuro. SIGNOS DE QUE EL BEB EST HAMBRIENTO Primeros signos de hambre  Aumenta su estado de alerta o actividad.  Se estira.  Mueve la cabeza de un lado a otro.  Mueve la cabeza y   abre la boca cuando se le toca la mejilla o la comisura de la boca (reflejo de bsqueda).  Aumenta las vocalizaciones, tales como sonidos de succin, se relame los labios, emite arrullos, suspiros, o chirridos.  Mueve la mano hacia la boca.  Se chupa con ganas los dedos o las manos. Signos tardos de hambre  Est agitado.  Llora de manera intermitente. Signos de hambre extrema Los signos de hambre extrema requerirn que lo calme y lo consuele antes de que el beb pueda alimentarse adecuadamente. No espere a que se manifiesten los siguientes signos de hambre extrema para comenzar a amamantar:   Agitacin.  Llanto intenso y fuerte.   Gritos. INFORMACIN BSICA SOBRE LA LACTANCIA MATERNA Iniciacin de la lactancia materna  Encuentre un lugar cmodo para sentarse o acostarse, con un buen respaldo para el cuello y la espalda.  Coloque una almohada o una manta enrollada debajo del beb para acomodarlo a la altura de la mama (si est sentada). Las almohadas para amamantar se han diseado especialmente a fin de servir de apoyo para los brazos y el beb mientras amamanta.  Asegrese de que el abdomen del  beb est frente al suyo.  Masajee suavemente la mama. Con las yemas de los dedos, masajee la pared del pecho hacia el pezn en un movimiento circular. Esto estimula el flujo de leche. Es posible que deba continuar este movimiento mientras amamanta si la leche fluye lentamente.  Sostenga la mama con el pulgar por arriba del pezn y los otros 4 dedos por debajo de la mama. Asegrese de que los dedos se encuentren lejos del pezn y de la boca del beb.  Empuje suavemente los labios del beb con el pezn o con el dedo.  Cuando la boca del beb se abra lo suficiente, acrquelo rpidamente a la mama e introduzca todo el pezn y la zona oscura que lo rodea (areola), tanto como sea posible, dentro de la boca del beb.  Debe haber ms areola visible por arriba del labio superior del beb que por debajo del labio inferior.  La lengua del beb debe estar entre la enca inferior y la mama.  Asegrese de que la boca del beb est en la posicin correcta alrededor del pezn (prendida). Los labios del beb deben crear un sello sobre la mama y estar doblados hacia afuera (invertidos).  Es comn que el beb succione durante 2 a 3 minutos para que comience el flujo de leche materna. Cmo debe prenderse Es muy importante que le ensee al beb cmo prenderse adecuadamente a la mama. Si el beb no se prende adecuadamente, puede causarle dolor en el pezn y reducir la produccin de leche materna, y hacer que el beb tenga un escaso aumento de peso. Adems, si el beb no se prende adecuadamente al pezn, puede tragar aire durante la alimentacin. Esto puede causarle molestias al beb. Hacer eructar al beb al cambiar de mama puede ayudarlo a liberar el aire. Sin embargo, ensearle al beb cmo prenderse a la mama adecuadamente es la mejor manera de evitar que se sienta molesto por tragar aire mientras se alimenta. Signos de que el beb se ha prendido adecuadamente al pezn:   Tironea o succiona de modo silencioso,  sin causarle dolor.  Se escucha que traga cada 3 o 4 succiones.   Hay movimientos musculares por arriba y por delante de sus odos al succionar. Signos de que el beb no se ha prendido adecuadamente al pezn:   Hace ruidos de succin o   de chasquido mientras se alimenta.  Siente dolor en el pezn. Si cree que el beb no se prendi correctamente, deslice el dedo en la comisura de la boca y colquelo entre las encas del beb para interrumpir la succin. Intente comenzar a amamantar nuevamente. Signos de lactancia materna exitosa Signos del beb:   Disminuye gradualmente el nmero de succiones o cesa la succin por completo.  Se duerme.  Relaja el cuerpo.  Retiene una pequea cantidad de leche en la boca.  Se desprende solo del pecho. Signos que presenta usted:  Las mamas han aumentado la firmeza, el peso y el tamao 1 a 3 horas despus de amamantar.  Estn ms blandas inmediatamente despus de amamantar.  Un aumento del volumen de leche, y tambin un cambio en su consistencia y color se producen hacia el quinto da de lactancia materna.  Los pezones no duelen, ni estn agrietados ni sangran. Signos de que su beb recibe la cantidad de leche suficiente  Moja al menos 3 paales en 24 horas. La orina debe ser clara y de color amarillo plido a los 5 das de vida.  Defeca al menos 3 veces en 24 horas a los 5 das de vida. La materia fecal debe ser blanda y amarillenta.  Defeca al menos 3 veces en 24 horas a los 7 das de vida. La materia fecal debe ser grumosa y amarillenta.  No registra una prdida de peso mayor del 10% del peso al nacer durante los primeros 3 das de vida.  Aumenta de peso un promedio de 4 a 7onzas (113 a 198g) por semana despus de los 4 das de vida.  Aumenta de peso, diariamente, de manera uniforme a partir de los 5 das de vida, sin registrar prdida de peso despus de las 2semanas de vida. Despus de alimentarse, es posible que el beb regurgite una  pequea cantidad. Esto es frecuente. FRECUENCIA Y DURACIN DE LA LACTANCIA MATERNA El amamantamiento frecuente la ayudar a producir ms leche y a prevenir problemas de dolor en los pezones e hinchazn en las mamas. Alimente al beb cuando muestre signos de hambre o si siente la necesidad de reducir la congestin de las mamas. Esto se denomina "lactancia a demanda". Evite el uso del chupete mientras trabaja para establecer la lactancia (las primeras 4 a 6 semanas despus del nacimiento del beb). Despus de este perodo, podr ofrecerle un chupete. Las investigaciones demostraron que el uso del chupete durante el primer ao de vida del beb disminuye el riesgo de desarrollar el sndrome de muerte sbita del lactante (SMSL). Permita que el nio se alimente en cada mama todo lo que desee. Contine amamantando al beb hasta que haya terminado de alimentarse. Cuando el beb se desprende o se queda dormido mientras se est alimentando de la primera mama, ofrzcale la segunda. Debido a que, con frecuencia, los recin nacidos permanecen somnolientos las primeras semanas de vida, es posible que deba despertar al beb para alimentarlo. Los horarios de lactancia varan de un beb a otro. Sin embargo, las siguientes reglas pueden servir como gua para ayudarla a garantizar que el beb se alimenta adecuadamente:  Se puede amamantar a los recin nacidos (bebs de 4 semanas o menos de vida) cada 1 a 3 horas.  No deben transcurrir ms de 3 horas durante el da o 5 horas durante la noche sin que se amamante a los recin nacidos.  Debe amamantar al beb 8 veces como mnimo en un perodo de 24 horas, hasta que comience a   introducir slidos en su dieta, a los 6 meses de vida aproximadamente. EXTRACCIN DE LECHE MATERNA La extraccin y el almacenamiento de la leche materna le permiten asegurarse de que el beb se alimente exclusivamente de leche materna, aun en momentos en los que no puede amamantar. Esto tiene especial  importancia si debe regresar al trabajo en el perodo en que an est amamantando o si no puede estar presente en los momentos en que el beb debe alimentarse. Su asesor en lactancia puede orientarla sobre cunto tiempo es seguro almacenar leche materna.  El sacaleche es un aparato que le permite extraer leche de la mama a un recipiente estril. Luego, la leche materna extrada puede almacenarse en un refrigerador o congelador. Algunos sacaleches son manuales, mientras que otros son elctricos. Consulte a su asesor en lactancia qu tipo ser ms conveniente para usted. Los sacaleches se pueden comprar; sin embargo, algunos hospitales y grupos de apoyo a la lactancia materna alquilan sacaleches mensualmente. Un asesor en lactancia puede ensearle cmo extraer leche materna manualmente, en caso de que prefiera no usar un sacaleche.  CMO CUIDAR LAS MAMAS DURANTE LA LACTANCIA MATERNA Los pezones se secan, agrietan y duelen durante la lactancia materna. Las siguientes recomendaciones pueden ayudarla a mantener las mamas humectadas y sanas:  Evite usar jabn en los pezones.  Use un sostn de soporte. Aunque no son esenciales, las camisetas sin mangas o los sostenes especiales para amamantar estn diseados para acceder fcilmente a las mamas, para amamantar sin tener que quitarse todo el sostn o la camiseta. Evite usar sostenes con aro o sostenes muy ajustados.  Seque al aire sus pezones durante 3 a 4minutos despus de amamantar al beb.  Utilice solo apsitos de algodn en el sostn para absorber las prdidas de leche. La prdida de un poco de leche materna entre las tomas es normal.  Utilice lanolina sobre los pezones luego de amamantar. La lanolina ayuda a mantener la humedad normal de la piel. Si usa lanolina pura, no tiene que lavarse los pezones antes de volver a alimentar al beb. La lanolina pura no es txica para el beb. Adems, puede extraer manualmente algunas gotas de leche materna y masajear  suavemente esa leche sobre los pezones, para que la leche se seque al aire. Durante las primeras semanas despus de dar a luz, algunas mujeres pueden experimentar hinchazn en las mamas (congestin mamaria). La congestin puede hacer que sienta las mamas pesadas, calientes y sensibles al tacto. El pico de la congestin ocurre dentro de los 3 a 5 das despus del parto. Las siguientes recomendaciones pueden ayudarla a aliviar la congestin:  Vace por completo las mamas al amamantar o extraer leche. Puede aplicar calor hmedo en las mamas (en la ducha o con toallas hmedas para manos) antes de amamantar o extraer leche. Esto aumenta la circulacin y ayuda a que la leche fluya. Si el beb no vaca por completo las mamas cuando lo amamanta, extraiga la leche restante despus de que haya finalizado.  Use un sostn ajustado (para amamantar o comn) o una camiseta sin mangas durante 1 o 2 das para indicar al cuerpo que disminuya ligeramente la produccin de leche.  Aplique compresas de hielo sobre las mamas, a menos que le resulte demasiado incmodo.  Asegrese de que el beb est prendido y se encuentre en la posicin correcta mientras lo alimenta. Si la congestin persiste luego de 48 horas o despus de seguir estas recomendaciones, comunquese con su mdico o un asesor en lactancia. RECOMENDACIONES GENERALES   PARA EL CUIDADO DE LA SALUD DURANTE LA LACTANCIA MATERNA  Consuma alimentos saludables. Alterne comidas y colaciones, y coma 3 de cada una por da. Dado que lo que come afecta la leche materna, es posible que algunas comidas hagan que su beb se vuelva ms irritable de lo habitual. Evite comer este tipo de alimentos si percibe que afectan de manera negativa al beb.  Beba leche, jugos de fruta y agua para satisfacer su sed (aproximadamente 10 vasos al da).  Descanse con frecuencia, reljese y tome sus vitaminas prenatales para evitar la fatiga, el estrs y la anemia.  Contine con los  autocontroles de la mama.  Evite masticar y fumar tabaco.  Evite el consumo de alcohol y drogas. Algunos medicamentos, que pueden ser perjudiciales para el beb, pueden pasar a travs de la leche materna. Es importante que consulte a su mdico antes de tomar cualquier medicamento, incluidos todos los medicamentos recetados y de venta libre, as como los suplementos vitamnicos y herbales. Puede quedar embarazada durante la lactancia. Si desea controlar la natalidad, consulte a su mdico cules son las opciones ms seguras para el beb. SOLICITE ATENCIN MDICA SI:   Usted siente que quiere dejar de amamantar o se siente frustrada con la lactancia.  Siente dolor en las mamas o en los pezones.  Sus pezones estn agrietados o sangran.  Sus pechos estn irritados, sensibles o calientes.  Tiene un rea hinchada en cualquiera de las mamas.  Siente escalofros o fiebre.  Tiene nuseas o vmitos.  Presenta una secrecin de otro lquido distinto de la leche materna de los pezones.  Sus mamas no se llenan antes de amamantar al beb para el quinto da despus del parto.  Se siente triste y deprimida.  El beb est demasiado somnoliento como para comer bien.  El beb tiene problemas para dormir.  Moja menos de 3 paales en 24 horas.  Defeca menos de 3 veces en 24 horas.  La piel del beb o la parte blanca de los ojos se vuelven amarillentas.  El beb no ha aumentado de peso a los 5 das de vida. SOLICITE ATENCIN MDICA DE INMEDIATO SI:   El beb est muy cansado (letargo) y no se quiere despertar para comer.  Le sube la fiebre sin causa. Document Released: 08/05/2005 Document Revised: 08/10/2013 [employer] Patient Information 2015 [employer]. This information is not intended to replace advice given to you by your health care provider. Make sure you discuss any questions you have with your health care provider.  

## 2015-03-02 NOTE — Progress Notes (Signed)
Alviris used for spanish interpreter  Reviewed tip of week with patient

## 2015-03-16 ENCOUNTER — Encounter: Payer: Self-pay | Admitting: Obstetrics & Gynecology

## 2015-03-16 ENCOUNTER — Ambulatory Visit (INDEPENDENT_AMBULATORY_CARE_PROVIDER_SITE_OTHER): Payer: Medicaid Other | Admitting: Obstetrics & Gynecology

## 2015-03-16 VITALS — BP 115/79 | HR 104 | Temp 98.7°F | Wt 154.8 lb

## 2015-03-16 DIAGNOSIS — Z3483 Encounter for supervision of other normal pregnancy, third trimester: Secondary | ICD-10-CM

## 2015-03-16 LAB — POCT URINALYSIS DIP (DEVICE)
Bilirubin Urine: NEGATIVE
Glucose, UA: NEGATIVE mg/dL
KETONES UR: NEGATIVE mg/dL
NITRITE: NEGATIVE
PROTEIN: NEGATIVE mg/dL
UROBILINOGEN UA: 0.2 mg/dL (ref 0.0–1.0)
pH: 6 (ref 5.0–8.0)

## 2015-03-16 LAB — OB RESULTS CONSOLE GBS: GBS: NEGATIVE

## 2015-03-16 NOTE — Progress Notes (Signed)
Subjective:  Ramatoulaye Delacruz Cletus Gash is a 34 y.o. G3P0011 at [redacted]w[redacted]d being seen today for ongoing prenatal care.  Patient reports no complaints.  Contractions: Irregular.  Vag. Bleeding: None. Movement: Present. Denies leaking of fluid.   The following portions of the patient's history were reviewed and updated as appropriate: allergies, current medications, past family history, past medical history, past social history, past surgical history and problem list.   Objective:   Filed Vitals:   03/16/15 1137  BP: 115/79  Pulse: 104  Temp: 98.7 F (37.1 C)  Weight: 154 lb 12.8 oz (70.217 kg)    Fetal Status: Fetal Heart Rate (bpm): 136   Movement: Present     General:  Alert, oriented and cooperative. Patient is in no acute distress.  Skin: Skin is warm and dry. No rash noted.   Cardiovascular: Normal heart rate noted  Respiratory: Normal respiratory effort, no problems with respiration noted  Abdomen: Soft, gravid, appropriate for gestational age. Pain/Pressure: Present     Vaginal: Vag. Bleeding: None.       Cervix: Exam revealed        Extremities: Normal range of motion.  Edema: Trace  Mental Status: Normal mood and affect. Normal behavior. Normal judgment and thought content.   Urinalysis: Urine Protein: Negative Urine Glucose: Negative  Assessment and Plan:  Pregnancy: G3P0011 at [redacted]w[redacted]d  1. Supervision of other normal pregnancy, antepartum, third trimester routine - Culture, beta strep (group b only) - GC/Chlamydia Probe Amp  Term labor symptoms and general obstetric precautions including but not limited to vaginal bleeding, contractions, leaking of fluid and fetal movement were reviewed in detail with the patient. Please refer to After Visit Summary for other counseling recommendations.  Return in about 1 week (around 03/23/2015).   Adam Phenix, MD

## 2015-03-16 NOTE — Progress Notes (Signed)
Reviewed tip of week with patient  Raquel used for interpreter

## 2015-03-16 NOTE — Patient Instructions (Signed)
Parto vaginal (Vaginal Delivery) Durante el parto, el mdico la ayudar a dar a luz a su beb. En elparto vaginal, deber pujar para que el beb salga por la vagina. Sin embargo, antes de que pueda sacar al beb, es necesario que ocurran ciertas cosas. La abertura del tero (cuello del tero) tiene que ablandarse, hacerse ms delgado y abrirse (dilatar) hasta que llegue a 10 cm. Adems, el beb tiene que bajar desde el tero a la vagina. SIGNOS DE TRABAJO DE PARTO  El mdico tendr primero que asegurarse de que usted est en trabajo de parto. Algunos signos son:   Eliminar lo que se llama tapn mucoso antes del inicio del trabajo de parto. Este es una pequea cantidad de mucosidad teida con sangre.  Tener contracciones uterinas regulares y dolorosas.   El tiempo entre las contracciones debe acortarse  Las molestias y el dolor se harn ms intensos gradualmente.  El dolor de las contracciones empeora al caminar y no se alivia con el reposo.   El cuello del tero se hace mas delgado (se borra) y se dilata. ANTES DEL PARTO Una vez que se inicie el trabajo de parto y sea admitida en el hospital o sanatorio, el mdico podr hacer lo siguiente:   Realizar un examen fsico.  Controlar si hay complicaciones relacionadas con el trabajo de parto.  Verificar su presin arterial, temperatura y pulso y la frecuencia cardaca (signos vitales).   Determinar si se ha roto el saco amnitico y cundo ha ocurrido.  Realizar un examen vaginal (utilizando un guante estril y un lubricante) para determinar:  La posicin (presentacin) del beb. El beb se presenta con la cabeza primero (vertex) en el canal de parto (vagina), o estn los pies o las nalgas primero (de nalgas)?  El nivel (estacin) de la cabeza del beb dentro del canal de parto.  El borramiento y la dilatacin del cuello uterino  El monitor fetal electrnico generalmente se coloca sobre el abdomen al llegar. Se utiliza para  controlar las contracciones y la frecuencia cardaca del beb.  Cuando el monitor est en el abdomen (monitor fetal externo), slo toma la frecuencia y la duracin de las contracciones. No informa acerca de la intensidad de las contracciones.  Si el mdico necesita saber exactamente la intensidad de las contracciones o cul es la frecuencia cardaca del beb, colocar un monitor interno en la vagina y el tero. El mdico comentar los riesgos y los beneficios de usar un monitor interno y le pedir autorizacin antes de colocar el dispositivo.  El monitoreo fetal continuo ser necesario si le han aplicado una epidural, si le administran ciertos medicamentos (como oxitocina) y si tiene complicaciones del embarazo o del trabajo de parto.  Podrn colocarle una va intravenosa en una vena del brazo para suministrarle lquidos y medicamentos, si es necesario. TRES ETAPAS DEL TRABAJO DE PARTO Y EL PARTO El trabajo de parto y el parto normales se dividen en tres etapas. Primera etapa Esta etapa comienza cuando comienzan las contracciones regulares y el cuello comienza a borrarse y dilatarse. Finaliza cuando el cuello est completamente abierto (completamente dilatado). La primera etapa es la etapa ms larga del trabajo de parto y puede durar desde 3 horas a 15 horas.  Algunos mtodos estn disponibles para ayudar con el dolor del parto. Usted y su mdico decidirn qu opcin es la mejor para usted. Las opciones incluyen:   Medicamentos narcticos. Estos son medicamentos fuertes que usted puede recibir a travs de una va intravenosa o   como inyeccin en el msculo. Estos medicamentos alivian el dolor pero no hacen que desaparezca completamente.  Epidural. Se administra un medicamento a travs de un tubo delgado que se inserta en la espalda. El medicamento adormece la parte inferior del cuerpo y evita el dolor en esa zona.  Bloqueo paracervical Es una inyeccin de un anestsico en cada lado del cuello  uterino.  Usted podr pedir un parto natural, que implica que no se usen analgsicos ni epidural durante el parto y el trabajo de parto. En cambio, podr tener otro tipo de ayuda como ejercicios respiratorios para hacer frente al dolor. Segunda etapa La segunda etapa del trabajo de parto comienza cuando el cuello se ha dilatado completamente a 10 cm. Contina hasta que usted puja al beb hacia abajo, por el canal de parto, y el beb nace. Esta etapa puede durar slo algunos minutos o algunas horas.  La posicin del la cabeza del beb a medida que pasa por el canal de parto, es informada como un nmero, llamado estacin. Si la cabeza del beb no ha iniciado su descenso, la estacin se describe como que est en menos 3 (-3). Cuando la cabeza del beb est en la estacin cero, est en el medio del canal de parto y se encaja en la pelvis. La estacin en la que se encuentra el beb indica el progreso de la segunda etapa del trabajo de parto.  Cuando el beb nace, el mdico lo sostendr con la cabeza hacia abajo para evitar que el lquido amnitico, el moco y la sangre entren en los pulmones del beb. La boca y la nariz del beb podrn ser succionadas con un pequeo bulbo para retirar todo lquido adicional.  El mdico podr colocar al beb sobre su estmago. Es importante evitar que el beb tome fro. Para hacerlo, el mdico secar al beb, lo colocar directamente sobre su piel, (sin mantas entre usted y el beb) y lo cubrir con mantas secas y tibias.  Se corta el cordn umbilical. Tercera etapa Durante la tercera etapa del trabajo de parto, el mdico sacar la placenta (alumbramiento) y se asegurar de que el sangrado est controlado. La salida de la placenta generalmente demora 5 minutos pero puede tardar hasta 30 minutos. Luego de la salida de la placenta, le darn un medicamento por va intravenosa o inyectable para ayudar a contraer el tero y controlar el sangrado. Si planea amamantar al beb,  puede intentar en este momento Luego de la salida de la placenta, el tero debe contraerse y quedar muy firme. Si el tero no queda firme, el mdico lo masajear. Esto es importante debido a que la contraccin del tero ayuda a cortar el sangrado en el sitio en que la placenta estaba unida al tero. Si el tero no se contrae adecuadamente ni permanece firme, podr causar un sangrado abundante. Si hay mucho sangrado, podrn darle medicamentos para contraer el tero y detener el sangrado.  Document Released: 07/18/2008 Document Revised: 12/20/2013 [employer] Patient Information 2015 [employer]. This information is not intended to replace advice given to you by your health care provider. Make sure you discuss any questions you have with your health care provider.  

## 2015-03-17 LAB — GC/CHLAMYDIA PROBE AMP
CT PROBE, AMP APTIMA: NEGATIVE
GC Probe RNA: NEGATIVE

## 2015-03-18 LAB — CULTURE, BETA STREP (GROUP B ONLY)

## 2015-03-23 ENCOUNTER — Ambulatory Visit (INDEPENDENT_AMBULATORY_CARE_PROVIDER_SITE_OTHER): Payer: Medicaid Other | Admitting: Family

## 2015-03-23 VITALS — BP 117/67 | HR 87 | Temp 98.4°F

## 2015-03-23 DIAGNOSIS — O2343 Unspecified infection of urinary tract in pregnancy, third trimester: Secondary | ICD-10-CM

## 2015-03-23 DIAGNOSIS — Z3483 Encounter for supervision of other normal pregnancy, third trimester: Secondary | ICD-10-CM

## 2015-03-23 DIAGNOSIS — R8271 Bacteriuria: Secondary | ICD-10-CM

## 2015-03-23 LAB — POCT URINALYSIS DIP (DEVICE)
BILIRUBIN URINE: NEGATIVE
Glucose, UA: NEGATIVE mg/dL
Ketones, ur: NEGATIVE mg/dL
Nitrite: NEGATIVE
Protein, ur: NEGATIVE mg/dL
Specific Gravity, Urine: 1.02 (ref 1.005–1.030)
Urobilinogen, UA: 1 mg/dL (ref 0.0–1.0)
pH: 7 (ref 5.0–8.0)

## 2015-03-23 MED ORDER — NITROFURANTOIN MONOHYD MACRO 100 MG PO CAPS: 100.0000 mg | ORAL_CAPSULE | Freq: Two times a day (BID) | ORAL | Status: DC

## 2015-03-23 NOTE — Progress Notes (Signed)
Ultrasound scheduled for 03/28/2015 @ 3:15PM

## 2015-03-23 NOTE — Progress Notes (Signed)
Subjective:  Dana Gibson is a 34 y.o. G3P1011 at [redacted]w[redacted]d being seen today for ongoing prenatal care.  Patient reports lower back pain that radiates to lower abdomen.  Denies contractions.  Declines cervical exam.  .  Contractions: Irregular.  Vag. Bleeding: None. Movement: Present. Denies leaking of fluid.   The following portions of the patient's history were reviewed and updated as appropriate: allergies, current medications, past family history, past medical history, past social history, past surgical history and problem list.   Objective:   Filed Vitals:   03/23/15 1046  BP: 117/67  Pulse: 87  Temp: 98.4 F (36.9 C)    Fetal Status: Fetal Heart Rate (bpm): 140   Movement: Present     General:  Alert, oriented and cooperative. Patient is in no acute distress.  Skin: Skin is warm and dry. No rash noted.   Cardiovascular: Normal heart rate noted  Respiratory: Normal respiratory effort, no problems with respiration noted  Abdomen: Soft, gravid, appropriate for gestational age. Pain/Pressure: Present     Vaginal: Vag. Bleeding: None.       Cervix: Not evaluated        Extremities: Normal range of motion.  Edema: Trace  Mental Status: Normal mood and affect. Normal behavior. Normal judgment and thought content.   Urinalysis: Urine Protein: Negative Urine Glucose: Negative  Assessment and Plan:  Pregnancy: G3P1011 at [redacted]w[redacted]d  1. Supervision of other normal pregnancy, antepartum, third trimester - US OB Follow Up; Future > to reassess renal pelvis  2. Bacteria in urine - Culture, OB Urine - Macrobid BID  Term labor symptoms and general obstetric precautions including but not limited to vaginal bleeding, contractions, leaking of fluid and fetal movement were reviewed in detail with the patient. Please refer to After Visit Summary for other counseling recommendations.  Return in about 1 week (around 03/30/2015).   Eino Farber Kennith Gain, CNM

## 2015-03-23 NOTE — Progress Notes (Signed)
Spanish interpreter Avi Edema- feet  Pain- radiate from lower back to lower abd

## 2015-03-24 LAB — CULTURE, OB URINE: Colony Count: 10000

## 2015-03-28 ENCOUNTER — Encounter (HOSPITAL_COMMUNITY): Payer: Self-pay

## 2015-03-28 ENCOUNTER — Other Ambulatory Visit: Payer: Self-pay | Admitting: Family

## 2015-03-28 ENCOUNTER — Ambulatory Visit (HOSPITAL_COMMUNITY)
Admission: RE | Admit: 2015-03-28 | Discharge: 2015-03-28 | Disposition: A | Discharge status: Home / Self Care | Payer: Medicaid Other | Source: Ambulatory Visit | Attending: Family | Admitting: Family

## 2015-03-28 DIAGNOSIS — O35EXX0 Maternal care for other (suspected) fetal abnormality and damage, fetal genitourinary anomalies, not applicable or unspecified: Secondary | ICD-10-CM

## 2015-03-28 DIAGNOSIS — Z3A38 38 weeks gestation of pregnancy: Secondary | ICD-10-CM | POA: Insufficient documentation

## 2015-03-28 DIAGNOSIS — O358XX0 Maternal care for other (suspected) fetal abnormality and damage, not applicable or unspecified: Secondary | ICD-10-CM

## 2015-03-28 DIAGNOSIS — O283 Abnormal ultrasonic finding on antenatal screening of mother: Secondary | ICD-10-CM | POA: Insufficient documentation

## 2015-03-28 DIAGNOSIS — Z3483 Encounter for supervision of other normal pregnancy, third trimester: Secondary | ICD-10-CM

## 2015-03-29 ENCOUNTER — Encounter (HOSPITAL_COMMUNITY): Payer: Self-pay | Admitting: Anesthesiology

## 2015-03-29 ENCOUNTER — Inpatient Hospital Stay (HOSPITAL_COMMUNITY)
Admission: AD | Admit: 2015-03-29 | Discharge: 2015-04-01 | DRG: 775 | Disposition: A | Discharge status: Home / Self Care | Payer: Medicaid Other | Source: Ambulatory Visit | Attending: Obstetrics & Gynecology | Admitting: Obstetrics & Gynecology

## 2015-03-29 DIAGNOSIS — Z3A39 39 weeks gestation of pregnancy: Secondary | ICD-10-CM | POA: Diagnosis present

## 2015-03-29 DIAGNOSIS — Z8249 Family history of ischemic heart disease and other diseases of the circulatory system: Secondary | ICD-10-CM | POA: Diagnosis not present

## 2015-03-29 DIAGNOSIS — O9912 Other diseases of the blood and blood-forming organs and certain disorders involving the immune mechanism complicating childbirth: Secondary | ICD-10-CM | POA: Diagnosis present

## 2015-03-29 DIAGNOSIS — Z862 Personal history of diseases of the blood and blood-forming organs and certain disorders involving the immune mechanism: Secondary | ICD-10-CM

## 2015-03-29 DIAGNOSIS — D696 Thrombocytopenia, unspecified: Secondary | ICD-10-CM | POA: Diagnosis not present

## 2015-03-29 DIAGNOSIS — O0993 Supervision of high risk pregnancy, unspecified, third trimester: Secondary | ICD-10-CM

## 2015-03-29 DIAGNOSIS — O365930 Maternal care for other known or suspected poor fetal growth, third trimester, not applicable or unspecified: Principal | ICD-10-CM | POA: Diagnosis present

## 2015-03-29 DIAGNOSIS — D691 Qualitative platelet defects: Secondary | ICD-10-CM | POA: Diagnosis present

## 2015-03-29 LAB — ABO/RH: ABO/RH(D): O POS

## 2015-03-29 LAB — CBC
HEMATOCRIT: 34.6 % — AB (ref 36.0–46.0)
Hemoglobin: 11.6 g/dL — ABNORMAL LOW (ref 12.0–15.0)
MCH: 27.9 pg (ref 26.0–34.0)
MCHC: 33.5 g/dL (ref 30.0–36.0)
MCV: 83.2 fL (ref 78.0–100.0)
PLATELETS: 233 10*3/uL (ref 150–400)
RBC: 4.16 MIL/uL (ref 3.87–5.11)
RDW: 14.2 % (ref 11.5–15.5)
WBC: 8.8 10*3/uL (ref 4.0–10.5)

## 2015-03-29 LAB — TYPE AND SCREEN
ABO/RH(D): O POS
ANTIBODY SCREEN: NEGATIVE

## 2015-03-29 MED ORDER — ACETAMINOPHEN 325 MG PO TABS: 650.0000 mg | ORAL_TABLET | ORAL | Status: DC | PRN

## 2015-03-29 MED ORDER — FLEET ENEMA 7-19 GM/118ML RE ENEM: 1.0000 | ENEMA | RECTAL | Status: DC | PRN

## 2015-03-29 MED ORDER — LACTATED RINGERS IV SOLN: 500.0000 mL | INTRAVENOUS | Status: DC | PRN

## 2015-03-29 MED ORDER — OXYTOCIN 40 UNITS IN LACTATED RINGERS INFUSION - SIMPLE MED
1.0000 m[IU]/min | INTRAVENOUS | Status: DC
  Administered 2015-03-29: 2 m[IU]/min via INTRAVENOUS

## 2015-03-29 MED ORDER — TERBUTALINE SULFATE 1 MG/ML IJ SOLN
0.2500 mg | Freq: Once | INTRAMUSCULAR | Status: DC | PRN
  Filled 2015-03-29: qty 1

## 2015-03-29 MED ORDER — NALBUPHINE HCL 10 MG/ML IJ SOLN
10.0000 mg | Freq: Four times a day (QID) | INTRAMUSCULAR | Status: DC | PRN
  Administered 2015-03-30: 10 mg via INTRAVENOUS
  Filled 2015-03-29: qty 1

## 2015-03-29 MED ORDER — ONDANSETRON HCL 4 MG/2ML IJ SOLN
4.0000 mg | Freq: Four times a day (QID) | INTRAMUSCULAR | Status: DC | PRN
Start: 2015-03-29 — End: 2015-03-30

## 2015-03-29 MED ORDER — NALBUPHINE HCL 10 MG/ML IJ SOLN
10.0000 mg | Freq: Four times a day (QID) | INTRAMUSCULAR | Status: DC | PRN
  Administered 2015-03-30: 10 mg via INTRAMUSCULAR
  Filled 2015-03-29 (×2): qty 1

## 2015-03-29 MED ORDER — OXYTOCIN 40 UNITS IN LACTATED RINGERS INFUSION - SIMPLE MED
62.5000 mL/h | INTRAVENOUS | Status: DC
  Filled 2015-03-29: qty 1000

## 2015-03-29 MED ORDER — OXYCODONE-ACETAMINOPHEN 5-325 MG PO TABS: 2.0000 | ORAL_TABLET | ORAL | Status: DC | PRN

## 2015-03-29 MED ORDER — LIDOCAINE HCL (PF) 1 % IJ SOLN
30.0000 mL | INTRAMUSCULAR | Status: DC | PRN
  Filled 2015-03-29: qty 30

## 2015-03-29 MED ORDER — FENTANYL CITRATE (PF) 100 MCG/2ML IJ SOLN
100.0000 ug | INTRAMUSCULAR | Status: DC | PRN
  Administered 2015-03-29 – 2015-03-30 (×3): 100 ug via INTRAVENOUS
  Filled 2015-03-29 (×3): qty 2

## 2015-03-29 MED ORDER — CITRIC ACID-SODIUM CITRATE 334-500 MG/5ML PO SOLN: 30.0000 mL | ORAL | Status: DC | PRN

## 2015-03-29 MED ORDER — OXYTOCIN BOLUS FROM INFUSION
500.0000 mL | INTRAVENOUS | Status: DC
  Administered 2015-03-30: 500 mL via INTRAVENOUS

## 2015-03-29 MED ORDER — OXYCODONE-ACETAMINOPHEN 5-325 MG PO TABS: 1.0000 | ORAL_TABLET | ORAL | Status: DC | PRN

## 2015-03-29 MED ORDER — MISOPROSTOL 200 MCG PO TABS
1000.0000 ug | ORAL_TABLET | Freq: Once | ORAL | Status: AC
  Administered 2015-03-30: 800 ug via ORAL
  Filled 2015-03-29: qty 5

## 2015-03-29 MED ORDER — LACTATED RINGERS IV SOLN
INTRAVENOUS | Status: DC
  Administered 2015-03-29 – 2015-03-30 (×2): via INTRAVENOUS

## 2015-03-29 NOTE — Progress Notes (Signed)
Patient s/p FB at 1330. Cervix 3.5/50/-2.  Pitocin started s/p FB.   FHR: baseline 135, good variability, + accels, no decels Toco: q2-6 minutes   Assessment/Plan: Dana Gibson is a 34 y.o. G3P1011 at 102w0d IOL 2/2 IUGR <10% #labor: continue to augment with pitocin  #pain: possible candidate for pudendal nerve block when in active labor  #FWB Cat I  #anticipate NSVD

## 2015-03-29 NOTE — Progress Notes (Signed)
I stopped to check patients need, by Orlan Leavens Spanish Interpreter.

## 2015-03-29 NOTE — Progress Notes (Signed)
Labor Progress Note  S: Pt seen w/o RN at bedside; currently comfortable; recently checked  O:  BP 120/81 mmHg  Pulse 94  Temp(Src) 98.3 F (36.8 C) (Oral)  Resp 20  Ht  (1.6 m)  Wt 71.215 kg (157 lb)  BMI 27.82 kg/m2  LMP 06/29/2014 (Exact Date) Cat FHR 120s; mod var; +acels, -decels, contx q2-3 min CVE: Dilation: 4.5 Effacement (%): 70 Cervical Position: Middle Station: -2 Presentation: Vertex Exam by:: Violeta Gelinas, RN   A&P: 34 y.o. G3P1011 [redacted]w[redacted]d making progress; of note, pt w/ thrombasthenia and w/o epi #fent for pain control #q2-3 hr checks #cont pit titration #expecting normal progression to vag del  Lowanda Foster, MD 9:17 PM

## 2015-03-29 NOTE — H&P (Signed)
LABOR ADMISSION HISTORY AND PHYSICAL  Dana Gibson is a 34 y.o. female G3P1011 with IUP at [redacted]w[redacted]d by LMP c/w 15 wk sono presenting for IOL 2/2 IUGR. She reports +FMs, No LOF, no VB, no blurry vision, headaches or peripheral edema, and RUQ pain.  She plans on breast feeding. She request undecided for birth control.  Dating: By LMP c/w 15 wk sono  --->  Estimated Date of Delivery: 04/05/15  Sono:    @[redacted]w[redacted]d , CWD, normal anatomy, cephalic presentation,  2620g, <10% EFW, UA dopplers are normal   Prenatal History/Complications:  Has hx of thrombasthenia. In previous SVD, required blood products to facilitate coagulation. Recommendations from heme/onc bleeding control problems during intrapartum are present in the overview of her problem list.   Past Medical History: Past Medical History  Diagnosis Date  . Thrombocytopenia     Past Surgical History: Past Surgical History  Procedure Laterality Date  . No past surgeries      Obstetrical History: OB History    Gravida Para Term Preterm AB TAB SAB Ectopic Multiple Living   3 1 1  1  1   1       Social History: Social History   Social History  . Marital Status: Married    Spouse Name: N/A  . Number of Children: N/A  . Years of Education: N/A   Social History Main Topics  . Smoking status: Never Smoker   . Smokeless tobacco: Never Used  . Alcohol Use: No  . Drug Use: No  . Sexual Activity: Yes    Birth Control/ Protection: None   Other Topics Concern  . Not on file   Social History Narrative    Family History: Family History  Problem Relation Age of Onset  . Hypertension Father     Allergies: Allergies  Allergen Reactions  . Asa [Aspirin] Other (See Comments)    PT HAS THROMBOCYTOPENIA-   ASA   MAKES  HER BLEED MORE    Prescriptions prior to admission  Medication Sig Dispense Refill Last Dose  . Prenatal Vit-Fe Fumarate-FA (PRENATAL MULTIVITAMIN) TABS tablet Take 1 tablet by mouth daily at 12 noon.    03/28/2015 at Unknown time  . nitrofurantoin, macrocrystal-monohydrate, (MACROBID) 100 MG capsule Take 1 capsule (100 mg total) by mouth 2 (two) times daily. (Patient not taking: Reported on 03/29/2015) 14 capsule 0 Completed Course at Unknown time     Review of Systems   All systems reviewed and negative except as stated in HPI  Height 5\' 3"  (1.6 m), weight 71.215 kg (157 lb), last menstrual period 06/29/2014. General appearance: alert and cooperative Lungs: clear to auscultation bilaterally Heart: regular rate and rhythm Abdomen: soft, non-tender; bowel sounds normal Extremities: Homans sign is negative, no sign of DVT Presentation: cephalic Fetal monitoringBaseline: 135 bpm, Variability: Good {> 6 bpm), Accelerations: Reactive and Decelerations: Absent Uterine activity mild q3-6 minutes   Dilation: Fingertip Effacement (%): 50 Station: -3 Exam by:: m wilkins rnc   Prenatal labs: ABO, Rh: --/--/O POS (08/10 1610) Antibody: NEG (08/10 0922) Rubella:  Immune  RPR: NON REAC (05/19 0902)  HBsAg: NEGATIVE (03/24 0930)  HIV: NONREACTIVE (05/19 0902)  GBS:   Negative 1 hr Glucola 112 Genetic screening  Negative   Clinic [facility] Prenatal Labs  Dating 15 week ultrasound consistent with LMP Blood type: O+  Genetic Screen Quad: negative  Antibody: negative  Anatomic US WNL did not see all views of ductal arch-f/u 6 wks > continued limited views and borderline  renal pelvis > rescan in 6 wks > continued limited view, also borderline renal pelvis dilation at 24 wks> rescan Rubella: immune  GTT Third trimester: 112 RPR: NR  Flu vaccine 11/10/2014 HBsAg: negative  TDaP vaccine  01/05/15  HIV: NR  GBS  neg  GBS: neg   Contraception undecided Pap: negative 11/10/14  [employer] breast   Circumcision Outpatient circumcision   [facility]   Support Person Isaiah Serge (husband)        Prenatal Transfer Tool  Maternal Diabetes: No Genetic Screening: Normal Maternal Ultrasounds/Referrals: Normal Fetal Ultrasounds or other Referrals:  Referred to Materal Fetal Medicine  Maternal Substance Abuse:  No Significant Maternal Medications:  None Significant Maternal Lab Results: Lab values include: Group B Strep negative  Results for orders placed or performed during the hospital encounter of 03/29/15 (from the past 24 hour(s))  CBC   Collection Time: 03/29/15  9:22 AM  Result Value Ref Range   WBC 8.8 4.0 - 10.5 K/uL   RBC 4.16 3.87 - 5.11 MIL/uL   Hemoglobin 11.6 (L) 12.0 - 15.0 g/dL   HCT 16.1 (L) 09.6 - 04.5 %   MCV 83.2 78.0 - 100.0 fL   MCH 27.9 26.0 - 34.0 pg   MCHC 33.5 30.0 - 36.0 g/dL   RDW 40.9 81.1 - 91.4 %   Platelets 233 150 - 400 K/uL  Type and screen   Collection Time: 03/29/15  9:22 AM  Result Value Ref Range   ABO/RH(D) O POS    Antibody Screen NEG    Sample Expiration 04/01/2015     Patient Active Problem List   Diagnosis Date Noted  . Indication for care in labor or delivery 03/29/2015  . 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 Supervision of other normal pregnancy, antepartum 11/10/2014  . History of bleeding disorder 11/10/2014    Assessment: Dana Gibson is a 34 y.o. G3P1011 at [redacted]w[redacted]d here for IOL 2/2 IUGR <10%  #Labor:will place FB, pitocin for augmentation s/p FB prn  #Pain: anesthesiology unable to do epidural d/t clotting disorder, will control with IV pain meds prn  #FWB: Cat I  #ID:  GBS negative  #MOF: breast  #MOC:undecided  #Circ:  Outpatient circ  #Thrombasthenia: Have reviewed heme/onc recommendations for bleeding management. Will monitor closely during delivery and PP for bleeding.   De Hollingshead 03/29/2015, 11:32 AM   OB fellow attestation: I have seen and examined this patient; I agree with above documentation in the resident's note.   Dana Gibson is a 34 y.o. G3P1011 here for IOL 2/2 to IUGR  (<10th%)  PE: BP 111/72 mmHg  Pulse 97  Ht 5\' 3"  (1.6 m)  Wt 157 lb (71.215 kg)  BMI 27.82 kg/m2  LMP 06/29/2014 (Exact Date) Gen: calm comfortable, NAD Resp: normal effort, no distress Abd: gravid  ROS, labs, PMH reviewed  Plan: MOF: breast MOC: undecided ID: GBS neg FWB: Cat I Labor: IOL, placed FB and plan for pitocin when out. AROM if not progressing.  Circ: Outpatient Pain: IV medications only. NO epidural given platelets dysfunction. Consider puedenal block if necessary .   #Thrombasthenia: At risk for PP bleeding, will actively manage third stage and plan to place Cytotec. We confirmed with pharmacy that they have Amicar ready.  Per Heme/Onc recommendations 1. Give platelet transfusions daily for 5 days 1 unit single donor platelets 2. EACA (Amicar) 5 gms IV X 1 then 1 gram PO Q 1 hour until bleeding stops, maximum 13 g  per day 3. Alternatively AMICAR continuous infusion 1 g per hour   Federico Flake, MD Family Medicine, OB Fellow 03/29/2015, 2:07 PM

## 2015-03-30 ENCOUNTER — Encounter: Payer: Self-pay | Admitting: Family Medicine

## 2015-03-30 ENCOUNTER — Encounter (HOSPITAL_COMMUNITY): Payer: Self-pay | Admitting: *Deleted

## 2015-03-30 DIAGNOSIS — D696 Thrombocytopenia, unspecified: Secondary | ICD-10-CM

## 2015-03-30 DIAGNOSIS — Z3A39 39 weeks gestation of pregnancy: Secondary | ICD-10-CM

## 2015-03-30 DIAGNOSIS — O9912 Other diseases of the blood and blood-forming organs and certain disorders involving the immune mechanism complicating childbirth: Secondary | ICD-10-CM

## 2015-03-30 LAB — RPR: RPR Ser Ql: NONREACTIVE

## 2015-03-30 MED ORDER — WITCH HAZEL-GLYCERIN EX PADS: 1.0000 "application " | MEDICATED_PAD | CUTANEOUS | Status: DC | PRN

## 2015-03-30 MED ORDER — OXYCODONE-ACETAMINOPHEN 5-325 MG PO TABS
2.0000 | ORAL_TABLET | ORAL | Status: DC | PRN
  Administered 2015-03-30 – 2015-04-01 (×5): 2 via ORAL
  Filled 2015-03-30 (×5): qty 2

## 2015-03-30 MED ORDER — SIMETHICONE 80 MG PO CHEW: 80.0000 mg | CHEWABLE_TABLET | ORAL | Status: DC | PRN

## 2015-03-30 MED ORDER — TETANUS-DIPHTH-ACELL PERTUSSIS 5-2.5-18.5 LF-MCG/0.5 IM SUSP: 0.5000 mL | Freq: Once | INTRAMUSCULAR | Status: DC

## 2015-03-30 MED ORDER — DIBUCAINE 1 % RE OINT: 1.0000 "application " | TOPICAL_OINTMENT | RECTAL | Status: DC | PRN

## 2015-03-30 MED ORDER — DIPHENHYDRAMINE HCL 25 MG PO CAPS: 25.0000 mg | ORAL_CAPSULE | Freq: Four times a day (QID) | ORAL | Status: DC | PRN

## 2015-03-30 MED ORDER — LANOLIN HYDROUS EX OINT: TOPICAL_OINTMENT | CUTANEOUS | Status: DC | PRN

## 2015-03-30 MED ORDER — ONDANSETRON HCL 4 MG PO TABS: 4.0000 mg | ORAL_TABLET | ORAL | Status: DC | PRN

## 2015-03-30 MED ORDER — PRENATAL MULTIVITAMIN CH
1.0000 | ORAL_TABLET | Freq: Every day | ORAL | Status: DC
  Administered 2015-03-31 – 2015-04-01 (×2): 1 via ORAL
  Filled 2015-03-30 (×2): qty 1

## 2015-03-30 MED ORDER — ZOLPIDEM TARTRATE 5 MG PO TABS: 5.0000 mg | ORAL_TABLET | Freq: Every evening | ORAL | Status: DC | PRN

## 2015-03-30 MED ORDER — OXYCODONE-ACETAMINOPHEN 5-325 MG PO TABS
1.0000 | ORAL_TABLET | ORAL | Status: DC | PRN
  Administered 2015-03-30 – 2015-03-31 (×3): 1 via ORAL
  Filled 2015-03-30 (×3): qty 1

## 2015-03-30 MED ORDER — BENZOCAINE-MENTHOL 20-0.5 % EX AERO
1.0000 "application " | INHALATION_SPRAY | CUTANEOUS | Status: DC | PRN
  Administered 2015-03-30: 1 via TOPICAL
  Filled 2015-03-30 (×2): qty 56

## 2015-03-30 MED ORDER — ACETAMINOPHEN 325 MG PO TABS: 650.0000 mg | ORAL_TABLET | ORAL | Status: DC | PRN

## 2015-03-30 MED ORDER — BUTORPHANOL TARTRATE 2 MG/ML IJ SOLN
2.0000 mg | INTRAMUSCULAR | Status: DC | PRN
  Administered 2015-03-30: 2 mg via INTRAVENOUS
  Filled 2015-03-30: qty 2

## 2015-03-30 MED ORDER — SODIUM CHLORIDE 0.9 % IV SOLN
5.0000 g/h | Freq: Once | INTRAVENOUS | Status: DC
  Filled 2015-03-30: qty 20

## 2015-03-30 MED ORDER — PROMETHAZINE HCL 25 MG/ML IJ SOLN
12.5000 mg | Freq: Four times a day (QID) | INTRAMUSCULAR | Status: DC | PRN
  Administered 2015-03-30: 12.5 mg via INTRAVENOUS
  Filled 2015-03-30: qty 1

## 2015-03-30 MED ORDER — ONDANSETRON HCL 4 MG/2ML IJ SOLN: 4.0000 mg | INTRAMUSCULAR | Status: DC | PRN

## 2015-03-30 MED ORDER — SENNOSIDES-DOCUSATE SODIUM 8.6-50 MG PO TABS
2.0000 | ORAL_TABLET | ORAL | Status: DC
  Administered 2015-03-31: 2 via ORAL
  Filled 2015-03-30 (×2): qty 2

## 2015-03-30 NOTE — Progress Notes (Signed)
Labor Progress Note  S: Pt seen w/ RN and CNM at bedside; pt w/ sig pain w/ contx;  O:  BP 106/66 mmHg  Pulse 89  Temp(Src) 98.3 F (36.8 C) (Oral)  Resp 20  Ht  (1.6 m)  Wt 71.215 kg (157 lb)  BMI 27.82 kg/m2  LMP 06/29/2014 (Exact Date) FHR 120s; high var; +acels, +var decels; contx q2 min CVE: Dilation: 5 Effacement (%): 90 Cervical Position: Middle Station: -1, 0 Presentation: Vertex Exam by:: g. Cleophas Dunker, RN   A&P: 34 y.o. G3P1011 [redacted]w[redacted]d making slow progress; will AROM at this time #AROM  #cont pit titration #will add stadol and phenergan to pain regimen #expecting normal vag del  Lowanda Foster, MD 2:49 AM

## 2015-03-30 NOTE — Progress Notes (Signed)
I stopped to check patients need I ordered her meals, also I assisted RN with teaching about automatic breast pump, by Orlan Leavens Spanish Interpreter

## 2015-03-30 NOTE — Progress Notes (Signed)
UR chart review completed.  

## 2015-03-30 NOTE — Lactation Note (Signed)
This note was copied from the chart of Dana [employer] trader. Lactation Consultation Note Initial visit at 15 hours of age with spanish interpreter.  Baby is [redacted]w[redacted]d and only 4#15oz.  MOm has been set up with a DEBP and used a syringe with EBM at the breast for supplementation once.  Mom breastfed recently for about 10 minutes and offered a bottle of formula the baby did not take.  Dicussed triple feeding plan due to low weight.  Mom to breastfeed, supplement and pump every 3 hours or 8x/24 hours.  Mom has easily expressed colostrum flowing from breast.  Baby is asleep not sucking at breast.   Discussed limiting total feeding time to about 30 minutes.   Mom is receptive to teaching.  [facility] LC resources given and discussed.  Encouraged to feed with early cues on demand.  Early newborn behavior discussed. Feeding plan discussed with MBU RN.  Mom to call for assist as needed.     Patient Name: Dana Gibson [id number] Date: 03/30/2015 Reason for consult: Initial assessment;Infant < 6lbs   Maternal Data Has patient been taught Hand Expression?: Yes Does the patient have breastfeeding experience prior to this delivery?: Yes  Feeding Feeding Type: Breast Fed Length of feed:  (only a few sucks after mom reports about 10 minutes on breast)  LATCH Score/Interventions Latch: Grasps breast easily, tongue down, lips flanged, rhythmical sucking. Intervention(s): Adjust position;Assist with latch;Breast massage;Breast compression  Audible Swallowing: None Intervention(s): Skin to skin  Type of Nipple: Everted at rest and after stimulation  Comfort (Breast/Nipple): Soft / non-tender     Hold (Positioning): Assistance needed to correctly position infant at breast and maintain latch. Intervention(s): Breastfeeding basics reviewed;Support Pillows;Position options;Skin to skin  LATCH Score: 7  Lactation Tools Discussed/Used     Consult Status Consult Status: Follow-up Date:  03/31/15 Follow-up type: In-patient    Dana Gibson, Dana Gibson 03/30/2015, 9:35 PM

## 2015-03-30 NOTE — Progress Notes (Signed)
I assisted Therapist, art from lactation with some questions and informations about feeding , by Orlan Leavens Spanish Interpreter

## 2015-03-31 ENCOUNTER — Encounter (HOSPITAL_COMMUNITY): Payer: Self-pay

## 2015-03-31 LAB — CBC
HCT: 33.5 % — ABNORMAL LOW (ref 36.0–46.0)
HEMOGLOBIN: 10.9 g/dL — AB (ref 12.0–15.0)
MCH: 27.6 pg (ref 26.0–34.0)
MCHC: 32.5 g/dL (ref 30.0–36.0)
MCV: 84.8 fL (ref 78.0–100.0)
PLATELETS: 179 10*3/uL (ref 150–400)
RBC: 3.95 MIL/uL (ref 3.87–5.11)
RDW: 14.3 % (ref 11.5–15.5)
WBC: 9 10*3/uL (ref 4.0–10.5)

## 2015-03-31 MED ORDER — ACETAMINOPHEN 325 MG PO TABS: 650.0000 mg | ORAL_TABLET | Freq: Four times a day (QID) | ORAL | Status: AC | PRN

## 2015-03-31 NOTE — Discharge Instructions (Signed)

## 2015-03-31 NOTE — Discharge Summary (Signed)
Obstetric Discharge Summary Reason for Admission: induction of labor Prenatal Procedures: none Intrapartum Procedures: spontaneous vaginal delivery Postpartum Procedures: none Complications-Operative and Postpartum: none  Delivery Note At 5:36 AM a viable female was delivered via Vaginal, Spontaneous Delivery (Presentation: OA ) by Dr Freida Busman. APGAR: 7, 9; weight 4 lb 15.5 oz (2255 g). Infant dried and placed on pt's abd. Cord clamped and cut by FOB. Hospital cord blood sample collected.  Placenta status: Intact, Spontaneous. Cord: 3 vessels   Anesthesia: None  Episiotomy: None Lacerations: None Est. Blood Loss (mL): 100  Pt given  of cytotec PO as a preventative measure. FF w/ nl lochia.  Mom to postpartum. Baby to Couplet care / Skin to Skin.   Hospital Course:  Active Problems:   Indication for care in labor or delivery   Dana Gibson is a 34 y.o. [id number] s/p SVD.  Patient was admitted 8/10 for IOL 2/2 IUGR.  She has postpartum course that was uncomplicated including no problems with ambulating, PO intake, urination, pain, or bleeding. The pt feels ready to go home and  will be discharged with outpatient follow-up.   Today: No acute events overnight.  Pt denies problems with ambulating, voiding or po intake.  She denies nausea or vomiting.  Pain is well controlled.  She has had flatus. She has not had bowel movement.  Lochia Small.  Plan for birth control is  no method.  Method of Feeding: breast  Physical Exam:  General: alert, cooperative, appears stated age and no distress Lochia: appropriate Uterine Fundus: firm DVT Evaluation: No evidence of DVT seen on physical exam.  H/H: Lab Results  Component Value Date/Time   HGB 11.6* 03/29/2015 09:22 AM   HCT 34.6* 03/29/2015 09:22 AM    Discharge Diagnoses: Term Pregnancy-delivered  Discharge Information: Date: 03/31/2015 Activity: pelvic rest Diet: routine  Medications: PNV and tylenol Breast  feeding:  Yes Condition: stable Instructions: refer to handout Discharge to: home      Medication List    STOP taking these medications        nitrofurantoin (macrocrystal-monohydrate) 100 MG capsule  Commonly known as:  MACROBID      TAKE these medications        acetaminophen 325 MG tablet  Commonly known as:  TYLENOL  Take 2 tablets (650 mg total) by mouth every 6 (six) hours as needed (for pain scale < 4).     prenatal multivitamin Tabs tablet  Take 1 tablet by mouth daily at 12 noon.           Follow-up Information    Follow up with [facility]. Schedule an appointment as soon as possible for a visit in 6 weeks.   Specialty:  Obstetrics and Gynecology   Why:  for postpartum visit   Contact information:   [address] [place] [address] [phone]      Lowanda Foster ,MD 03/31/2015,2:36 AM  OB FELLOW DISCHARGE ATTESTATION  I have seen and examined this patient and agree with above documentation in the resident's note. 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    Silvano Bilis, MD 10:43 AM

## 2015-03-31 NOTE — Progress Notes (Signed)
I stopped to check on patient's need I ordered her meals, by Viria Alvarez Spanish Interpreter °

## 2015-04-01 NOTE — Lactation Note (Signed)
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 Lactation Consultation Note  Patient Name: Dana Gibson Cletus Gash [id number] Date: 04/01/2015 Reason for consult: Follow-up assessment    With this mom and baby, now 62 hours old, and term, but SGA, now weighing 4 lbs 12 oz, but only at 4% weight loss. Mom is breast feeding on cue, more than every 3 hours, and pumping ad supplementing with EBm. Mom will continue  To do the same at home. I showed mom how to use the pump part that converts into a double or singe manual pump. Mom knows to call for lactation as needed.   Maternal Data    Feeding    LATCH Score/Interventions       Type of Nipple: Everted at rest and after stimulation  Comfort (Breast/Nipple): Soft / non-tender  Problem noted: Filling        Lactation Tools Discussed/Used Pump Review: Setup, frequency, and cleaning   Consult Status Consult Status: Complete Follow-up type: Call as needed    Alfred Levins 04/01/2015, 8:52 AM

## 2015-04-01 NOTE — Discharge Summary (Signed)
Obstetric Discharge Summary Reason for Admission: induction of labor for IUGR <10% Prenatal Procedures: none Intrapartum Procedures: spontaneous vaginal delivery Postpartum Procedures: none Complications-Operative and Postpartum: none HEMOGLOBIN  Date Value Ref Range Status  03/31/2015 10.9* 12.0 - 15.0 g/dL Final   HCT  Date Value Ref Range Status  03/31/2015 33.5* 36.0 - 46.0 % Final   Dana Gibson Cletus Gash is a 34yo [id number] admitted on 8/10 @ 39.0wks for IOL due to IUGR <10%. Pt also w/ thrombasthenia so precautions were taken to prevent PPH. Her cx was unfavorable so she rec'd a foley bulb followed by Pitocin. She progressed SVD early the next morning, w/ cytotec given as a precaution after delivery- minimal blood loss. By PPD#2 she is deemed to have received the full benefit of her hospital stay and she will be discharged home. She is breastfeeding and declines contraception.  Physical Exam:  General: alert, cooperative and no distress  Heart: RRR Lungs: nl effort Lochia: appropriate Uterine Fundus: firm DVT Evaluation: No evidence of DVT seen on physical exam.  Discharge Diagnoses: Term Pregnancy-delivered  Discharge Information: Date: 04/01/2015 Activity: pelvic rest Diet: routine Medications: PNV and Tylenol Condition: stable Instructions: refer to practice specific booklet Discharge to: home Follow-up Information    Follow up with [facility]. Schedule an appointment as soon as possible for a visit in 6 weeks.   Specialty:  Obstetrics and Gynecology   Why:  for postpartum visit   Contact information:   [address] [place] [address] [phone]      Newborn Data: Live born female  Birth Weight: 4 lb 15.5 oz (2254 g) APGAR: 7, 9  Home with mother.  Cam Hai CNM 04/01/2015, 9:07 AM

## 2015-04-06 ENCOUNTER — Encounter: Payer: Self-pay | Admitting: Obstetrics & Gynecology

## 2015-05-05 ENCOUNTER — Encounter: Payer: Self-pay | Admitting: Obstetrics & Gynecology

## 2015-05-05 ENCOUNTER — Ambulatory Visit (INDEPENDENT_AMBULATORY_CARE_PROVIDER_SITE_OTHER): Payer: Medicaid Other | Admitting: Obstetrics & Gynecology

## 2015-05-05 NOTE — Progress Notes (Signed)
Patient ID: Dana Gibson, female   DOB: 05/30/1981, 34 y.o.   MRN: [id number] Subjective:     Dana Gibson is a 34 y.o. female who presents for a postpartum visit. She is 5 weeks postpartum following a spontaneous vaginal delivery. I have fully reviewed the prenatal and intrapartum course. The delivery was at 39.1 gestational weeks. Outcome: spontaneous vaginal delivery. Anesthesia: none. Postpartum course has been unremarkable. Baby's course has been unremarkable. Baby is feeding by both breast and bottle - Similac Alimentum. Bleeding no bleeding. Bowel function is normal. Bladder function is normal. Patient is not sexually active. Contraception method is undecided. Postpartum depression screening: negative.  Dana Gibson used for interpreter  The following portions of the patient's history were reviewed and updated as appropriate: allergies, current medications, past family history, past medical history, past social history, past surgical history and problem list.  Review of Systems Pertinent items are noted in HPI.   Objective:    BP 139/78 mmHg  Pulse 84  Temp(Src) 98.5 F (36.9 C) (Oral)  Ht  (1.575 m)  Wt 142 lb 12.8 oz (64.774 kg)  BMI 26.11 kg/m2  Breastfeeding? Yes       Pt in NAD Assessment:     5 weeks postpartum exam. Pap smear not done at today's visit.   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 Contraception: wants to consider an IUD but, she wants to read about it first 2.  Follow up in: 2 weeks for IUD or as needed.     Garnie Borchardt L. Harraway-Smith, M.D., [place]n Core

## 2015-05-05 NOTE — Patient Instructions (Signed)
Levonorgestrel intrauterine device (IUD) What is this medicine? LEVONORGESTREL IUD (LEE voe nor jes trel) is a contraceptive (birth control) device. The device is placed inside the uterus by a healthcare professional. It is used to prevent pregnancy and can also be used to treat heavy bleeding that occurs during your period. Depending on the device, it can be used for 3 to 5 years. This medicine may be used for other purposes; ask your health care provider or pharmacist if you have questions. COMMON BRAND NAME(S): LILETTA, Mirena, Skyla What should I tell my health care provider before I take this medicine? They need to know if you have any of these conditions: -abnormal Pap smear -cancer of the breast, uterus, or cervix -diabetes -endometritis -genital or pelvic infection now or in the past -have more than one sexual partner or your partner has more than one partner -heart disease -history of an ectopic or tubal pregnancy -immune system problems -IUD in place -liver disease or tumor -problems with blood clots or take blood-thinners -use intravenous drugs -uterus of unusual shape -vaginal bleeding that has not been explained -an unusual or allergic reaction to levonorgestrel, other hormones, silicone, or polyethylene, medicines, foods, dyes, or preservatives -pregnant or trying to get pregnant -breast-feeding How should I use this medicine? This device is placed inside the uterus by a health care professional. Talk to your pediatrician regarding the use of this medicine in children. Special care may be needed. Overdosage: If you think you have taken too much of this medicine contact a poison control center or emergency room at once. NOTE: This medicine is only for you. Do not share this medicine with others. What if I miss a dose? This does not apply. What may interact with this medicine? Do not take this medicine with any of the following  medications: -amprenavir -bosentan -fosamprenavir This medicine may also interact with the following medications: -aprepitant -barbiturate medicines for inducing sleep or treating seizures -bexarotene -griseofulvin -medicines to treat seizures like carbamazepine, ethotoin, felbamate, oxcarbazepine, phenytoin, topiramate -modafinil -pioglitazone -rifabutin -rifampin -rifapentine -some medicines to treat HIV infection like atazanavir, indinavir, lopinavir, nelfinavir, tipranavir, ritonavir -St. John's wort -warfarin This list may not describe all possible interactions. Give your health care provider a list of all the medicines, herbs, non-prescription drugs, or dietary supplements you use. Also tell them if you smoke, drink alcohol, or use illegal drugs. Some items may interact with your medicine. What should I watch for while using this medicine? Visit your doctor or health care professional for regular check ups. See your doctor if you or your partner has sexual contact with others, becomes HIV positive, or gets a sexual transmitted disease. This product does not protect you against HIV infection (AIDS) or other sexually transmitted diseases. You can check the placement of the IUD yourself by reaching up to the top of your vagina with clean fingers to feel the threads. Do not pull on the threads. It is a good habit to check placement after each menstrual period. Call your doctor right away if you feel more of the IUD than just the threads or if you cannot feel the threads at all. The IUD may come out by itself. You may become pregnant if the device comes out. If you notice that the IUD has come out use a backup birth control method like condoms and call your health care provider. Using tampons will not change the position of the IUD and are okay to use during your period. What side effects may   I notice from receiving this medicine? Side effects that you should report to your doctor or  health care professional as soon as possible: -allergic reactions like skin rash, itching or hives, swelling of the face, lips, or tongue -fever, flu-like symptoms -genital sores -high blood pressure -no menstrual period for 6 weeks during use -pain, swelling, warmth in the leg -pelvic pain or tenderness -severe or sudden headache -signs of pregnancy -stomach cramping -sudden shortness of breath -trouble with balance, talking, or walking -unusual vaginal bleeding, discharge -yellowing of the eyes or skin Side effects that usually do not require medical attention (report to your doctor or health care professional if they continue or are bothersome): -acne -breast pain -change in sex drive or performance -changes in weight -cramping, dizziness, or faintness while the device is being inserted -headache -irregular menstrual bleeding within first 3 to 6 months of use -nausea This list may not describe all possible side effects. Call your doctor for medical advice about side effects. You may report side effects to FDA at 1-800-FDA-1088. Where should I keep my medicine? This does not apply. NOTE: This sheet is a summary. It may not cover all possible information. If you have questions about this medicine, talk to your doctor, pharmacist, or health care provider.  2015, [employer] Standard. (2011-09-05 13:54:04)  

## 2015-05-22 ENCOUNTER — Ambulatory Visit: Payer: Medicaid Other | Admitting: Family Medicine

## 2017-09-30 ENCOUNTER — Encounter: Payer: Self-pay | Admitting: *Deleted

## 2018-11-16 ENCOUNTER — Telehealth: Payer: Self-pay | Admitting: Obstetrics and Gynecology

## 2018-11-16 NOTE — Telephone Encounter (Signed)
The patient stated she would like to reschedule her New Ob intake. Informed the patient of the visit over the phone as she did not need to come in to the clinic. The patient would still like to reschedule the appointment. Cancelled the New Ob visit the patients intake visit is rescheduled for May.

## 2018-11-30 ENCOUNTER — Encounter: Payer: Self-pay | Admitting: Family Medicine

## 2018-12-22 ENCOUNTER — Ambulatory Visit (INDEPENDENT_AMBULATORY_CARE_PROVIDER_SITE_OTHER): Payer: Medicaid Other | Admitting: *Deleted

## 2018-12-22 ENCOUNTER — Other Ambulatory Visit: Payer: Self-pay

## 2018-12-22 DIAGNOSIS — O0990 Supervision of high risk pregnancy, unspecified, unspecified trimester: Secondary | ICD-10-CM

## 2018-12-22 NOTE — Progress Notes (Signed)
I connected with  Dana Gibson on 12/22/18 at  2:15 PM EDT by telephone and verified that I am speaking with the correct person using two identifiers. Interpreter [name] present on the call for encounter. Pt did not answer the mobile number and no VM set up. I called the home number which turned out to be pt's husband mobile number and he stated that he was @ work. He contacted his wife and then told us that her phone battery was dead and she needed to plug in the charger. He said to call her back in a few minutes. I called back 3 times over the next 20 minutes and pt still did not answer. I attempted call back to husband's phone to inform him the his wife could not be reached and that we would need to reschedule her appt. He did not answer.    Day, Drucilla Schmidt, RN 12/22/2018  2:17 PM

## 2019-02-01 ENCOUNTER — Telehealth: Payer: Self-pay | Admitting: Family Medicine

## 2019-02-01 NOTE — Telephone Encounter (Signed)
Attempted to call patient about her appointment on 6/16 @ 1:15. Patient's husband answered and stated that he would give the patient the appointment information. He was instructed that the appointment would be virtual.

## 2019-02-02 ENCOUNTER — Telehealth: Payer: Self-pay | Admitting: Family Medicine

## 2019-02-02 ENCOUNTER — Encounter: Payer: Medicaid Other | Admitting: *Deleted

## 2019-02-02 ENCOUNTER — Other Ambulatory Visit: Payer: Self-pay

## 2019-02-02 NOTE — Telephone Encounter (Signed)
Interpreter Eda called patient to remind her of her appt, patient stated that she go to a different office. We canceled all her appts here in our office.

## 2019-02-10 ENCOUNTER — Encounter: Payer: Medicaid Other | Admitting: Obstetrics & Gynecology
